# Patient Record
Sex: Female | Born: 2000
Health system: Southern US, Community
[De-identification: ages and names within clinical notes are randomized; demographics above are authoritative.]

## PROBLEM LIST (undated history)

## (undated) DIAGNOSIS — F32A Depression, unspecified: Secondary | ICD-10-CM

## (undated) DIAGNOSIS — M419 Scoliosis, unspecified: Secondary | ICD-10-CM

## (undated) HISTORY — DX: Depression, unspecified: F32.A

## (undated) HISTORY — DX: Scoliosis, unspecified: M41.9

---
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
## 2020-01-20 DIAGNOSIS — F411 Generalized anxiety disorder: Secondary | ICD-10-CM | POA: Diagnosis not present

## 2020-02-03 DIAGNOSIS — F411 Generalized anxiety disorder: Secondary | ICD-10-CM | POA: Diagnosis not present

## 2020-03-09 DIAGNOSIS — F411 Generalized anxiety disorder: Secondary | ICD-10-CM | POA: Diagnosis not present

## 2020-03-15 ENCOUNTER — Ambulatory Visit (HOSPITAL_COMMUNITY)
Admission: EM | Admit: 2020-03-15 | Discharge: 2020-03-15 | Disposition: A | Discharge status: Home / Self Care | Payer: BC Managed Care – PPO | Attending: Family Medicine | Admitting: Family Medicine

## 2020-03-15 ENCOUNTER — Encounter (HOSPITAL_COMMUNITY): Payer: Self-pay | Admitting: Emergency Medicine

## 2020-03-15 DIAGNOSIS — L03011 Cellulitis of right finger: Secondary | ICD-10-CM

## 2020-03-15 MED ORDER — CEPHALEXIN 500 MG PO CAPS: 500.0000 mg | ORAL_CAPSULE | Freq: Three times a day (TID) | ORAL | 0 refills | Status: AC

## 2020-03-15 NOTE — ED Provider Notes (Addendum)
[facility]    CSN: [id number] Arrival date & time: 03/15/20  0807      History   Chief Complaint Chief Complaint  Patient presents with  . Warts    HPI Teresa Landry is a 19 y.o. female.   Teresa Landry presents with complaints of pain and irritation to skin of right thumb, at medial nail bed. She states she had noted what she thought was a wart to the area, over the past two weeks. Skin toned lesion. Denies any nail biting or other trauma to the area. She applied over the counter salicylic acid treatment with a bandage over the area. Last night she noted increased pain. No drainage from the area that she is aware of. No swelling.    ROS per HPI, negative if not otherwise mentioned.      History reviewed. No pertinent past medical history.  There are no problems to display for this patient.   History reviewed. No pertinent surgical history.  OB History   No obstetric history on file.      Home Medications    Prior to Admission medications   Medication Sig Start Date End Date Taking? Authorizing Provider  cephALEXin (KEFLEX) 500 MG capsule Take 1 capsule (500 mg total) by mouth 3 (three) times daily for 7 days. 03/15/20 03/22/20  Georgetta Haber, NP    Family History Family History  Problem Relation Age of Onset  . Healthy Mother   . Healthy Father     Social History Social History   Tobacco Use  . Smoking status: Never Smoker  . Smokeless tobacco: Never Used  Substance Use Topics  . Alcohol use: Never  . Drug use: Never     Allergies   Patient has no known allergies.   Review of Systems Review of Systems   Physical Exam Triage Vital Signs ED Triage Vitals  Enc Vitals Group     BP 03/15/20 0847 113/73     Pulse Rate 03/15/20 0847 77     Resp 03/15/20 0847 13     Temp 03/15/20 0847 98.4 F (36.9 C)     Temp Source 03/15/20 0847 Oral     SpO2 03/15/20 0847 100 %     Weight --      Height --      Head Circumference --       Peak Flow --      Pain Score 03/15/20 0848 4     Pain Loc --      Pain Edu? --      Excl. in GC? --    No data found.  Updated Vital Signs BP 113/73 (BP Location: Left Arm)   Pulse 77   Temp 98.4 F (36.9 C) (Oral)   Resp 13   LMP 02/14/2020 (Approximate)   SpO2 100%    Physical Exam Constitutional:      General: She is not in acute distress.    Appearance: She is well-developed.  Cardiovascular:     Rate and Rhythm: Normal rate.  Pulmonary:     Effort: Pulmonary effort is normal.  Musculoskeletal:     Comments: Approximately 19mm oval area of white skin to medial aspect of right thumb nail, with two small black/ necrotic areas at the edges; no drainage; no fluctuance; mild redness to immediate edges but no extension of redness; pulp of thumb is soft and non tender   Skin:    General: Skin is warm and dry.  Neurological:  Mental Status: She is alert and oriented to person, place, and time.      UC Treatments / Results  Labs (all labs ordered are listed, but only abnormal results are displayed) Labs Reviewed - No data to display  EKG   Radiology No results found.  Procedures Procedures (including critical care time)  Medications Ordered in UC Medications - No data to display  Initial Impression / Assessment and Plan / UC Course  I have reviewed the triage vital signs and the nursing notes.  Pertinent labs & imaging results that were available during my care of the patient were reviewed by me and considered in my medical decision making (see chart for details).     Topical wart treatment and bandage had been applied to affected area, so difficult to determine what original lesion had been present. Discussed that goal of topical wart removal is to debride the wart in layers, however, so this may be expected course of treatment. Keflex provided due to small concern for infection. Encouraged follow up with PCP in two weeks if no improvement or if worsening.  Return precautions provided. Patient verbalized understanding and agreeable to plan.   Final Clinical Impressions(s) / UC Diagnoses   Final diagnoses:  Cellulitis of finger of right hand     Discharge Instructions     This still may be the expected course of the wart you are treating, as the goal of topical salicylic acid is to breakdown the wart in layers.  I have provided antibiotics as well in concern for some infection as well.  If no improvement or any worsening in the next two weeks please follow up with your primary care provider as you may need additional treatment of your wart.  Please return for any worsening- pain, redness, tight swelling to your finger.    ED Prescriptions    Medication Sig Dispense Auth. Provider   cephALEXin (KEFLEX) 500 MG capsule Take 1 capsule (500 mg total) by mouth 3 (three) times daily for 7 days. 21 capsule Georgetta Haber, NP     PDMP not reviewed this encounter.   Georgetta Haber, NP 03/15/20 0933    Georgetta Haber, NP 03/15/20 [phone]

## 2020-03-15 NOTE — ED Triage Notes (Signed)
Pt c/o wart on right thumb onset last week. She states she tried OTC tx but now it looks red and swollen and she is concerned it is infected.

## 2020-03-15 NOTE — Discharge Instructions (Signed)
This still may be the expected course of the wart you are treating, as the goal of topical salicylic acid is to breakdown the wart in layers.  I have provided antibiotics as well in concern for some infection as well.  If no improvement or any worsening in the next two weeks please follow up with your primary care provider as you may need additional treatment of your wart.  Please return for any worsening- pain, redness, tight swelling to your finger.

## 2020-03-16 DIAGNOSIS — F411 Generalized anxiety disorder: Secondary | ICD-10-CM | POA: Diagnosis not present

## 2020-04-06 DIAGNOSIS — B078 Other viral warts: Secondary | ICD-10-CM | POA: Diagnosis not present

## 2020-05-20 DIAGNOSIS — Z23 Encounter for immunization: Secondary | ICD-10-CM | POA: Diagnosis not present

## 2020-05-20 DIAGNOSIS — F419 Anxiety disorder, unspecified: Secondary | ICD-10-CM | POA: Diagnosis not present

## 2020-06-22 DIAGNOSIS — R5382 Chronic fatigue, unspecified: Secondary | ICD-10-CM | POA: Diagnosis not present

## 2020-07-06 DIAGNOSIS — R059 Cough, unspecified: Secondary | ICD-10-CM | POA: Diagnosis not present

## 2020-07-06 DIAGNOSIS — Z20822 Contact with and (suspected) exposure to covid-19: Secondary | ICD-10-CM | POA: Diagnosis not present

## 2020-07-06 DIAGNOSIS — J029 Acute pharyngitis, unspecified: Secondary | ICD-10-CM | POA: Diagnosis not present

## 2020-08-20 DIAGNOSIS — F419 Anxiety disorder, unspecified: Secondary | ICD-10-CM | POA: Diagnosis not present

## 2020-08-31 ENCOUNTER — Other Ambulatory Visit: Payer: BC Managed Care – PPO

## 2020-09-01 ENCOUNTER — Other Ambulatory Visit: Payer: Self-pay

## 2020-09-01 ENCOUNTER — Other Ambulatory Visit: Payer: BC Managed Care – PPO

## 2020-09-01 DIAGNOSIS — Z20822 Contact with and (suspected) exposure to covid-19: Secondary | ICD-10-CM

## 2020-09-03 LAB — NOVEL CORONAVIRUS, NAA: SARS-CoV-2, NAA: NOT DETECTED

## 2020-09-03 LAB — SARS-COV-2, NAA 2 DAY TAT

## 2020-09-16 DIAGNOSIS — F4322 Adjustment disorder with anxiety: Secondary | ICD-10-CM | POA: Diagnosis not present

## 2020-10-02 DIAGNOSIS — F4322 Adjustment disorder with anxiety: Secondary | ICD-10-CM | POA: Diagnosis not present

## 2020-10-05 DIAGNOSIS — F4322 Adjustment disorder with anxiety: Secondary | ICD-10-CM | POA: Diagnosis not present

## 2020-10-21 DIAGNOSIS — F4322 Adjustment disorder with anxiety: Secondary | ICD-10-CM | POA: Diagnosis not present

## 2020-11-06 DIAGNOSIS — F4322 Adjustment disorder with anxiety: Secondary | ICD-10-CM | POA: Diagnosis not present

## 2020-11-13 DIAGNOSIS — F4322 Adjustment disorder with anxiety: Secondary | ICD-10-CM | POA: Diagnosis not present

## 2020-11-18 DIAGNOSIS — F419 Anxiety disorder, unspecified: Secondary | ICD-10-CM | POA: Diagnosis not present

## 2020-11-27 DIAGNOSIS — F4322 Adjustment disorder with anxiety: Secondary | ICD-10-CM | POA: Diagnosis not present

## 2020-11-29 ENCOUNTER — Other Ambulatory Visit: Payer: Self-pay

## 2020-11-29 ENCOUNTER — Emergency Department (HOSPITAL_COMMUNITY)
Admission: EM | Admit: 2020-11-29 | Discharge: 2020-11-30 | Disposition: A | Discharge status: Home / Self Care | Payer: BC Managed Care – PPO | Attending: Emergency Medicine | Admitting: Emergency Medicine

## 2020-11-29 ENCOUNTER — Emergency Department (HOSPITAL_COMMUNITY): Payer: BC Managed Care – PPO

## 2020-11-29 ENCOUNTER — Encounter (HOSPITAL_COMMUNITY): Payer: Self-pay | Admitting: Emergency Medicine

## 2020-11-29 DIAGNOSIS — Y92000 Kitchen of unspecified non-institutional (private) residence as  the place of occurrence of the external cause: Secondary | ICD-10-CM | POA: Insufficient documentation

## 2020-11-29 DIAGNOSIS — R569 Unspecified convulsions: Secondary | ICD-10-CM | POA: Diagnosis not present

## 2020-11-29 DIAGNOSIS — W260XXA Contact with knife, initial encounter: Secondary | ICD-10-CM | POA: Insufficient documentation

## 2020-11-29 DIAGNOSIS — G40909 Epilepsy, unspecified, not intractable, without status epilepticus: Secondary | ICD-10-CM | POA: Diagnosis not present

## 2020-11-29 DIAGNOSIS — Z8669 Personal history of other diseases of the nervous system and sense organs: Secondary | ICD-10-CM | POA: Diagnosis not present

## 2020-11-29 DIAGNOSIS — S61211A Laceration without foreign body of left index finger without damage to nail, initial encounter: Secondary | ICD-10-CM | POA: Diagnosis not present

## 2020-11-29 DIAGNOSIS — S6992XA Unspecified injury of left wrist, hand and finger(s), initial encounter: Secondary | ICD-10-CM | POA: Diagnosis not present

## 2020-11-29 DIAGNOSIS — R9431 Abnormal electrocardiogram [ECG] [EKG]: Secondary | ICD-10-CM | POA: Diagnosis not present

## 2020-11-29 LAB — CBC WITH DIFFERENTIAL/PLATELET
Abs Immature Granulocytes: 0.04 10*3/uL (ref 0.00–0.07)
Basophils Absolute: 0 10*3/uL (ref 0.0–0.1)
Basophils Relative: 0 %
Eosinophils Absolute: 0.1 10*3/uL (ref 0.0–0.5)
Eosinophils Relative: 1 %
HCT: 40 % (ref 36.0–46.0)
Hemoglobin: 13.4 g/dL (ref 12.0–15.0)
Immature Granulocytes: 0 %
Lymphocytes Relative: 31 %
Lymphs Abs: 3 10*3/uL (ref 0.7–4.0)
MCH: 30.3 pg (ref 26.0–34.0)
MCHC: 33.5 g/dL (ref 30.0–36.0)
MCV: 90.5 fL (ref 80.0–100.0)
Monocytes Absolute: 0.7 10*3/uL (ref 0.1–1.0)
Monocytes Relative: 7 %
Neutro Abs: 5.7 10*3/uL (ref 1.7–7.7)
Neutrophils Relative %: 61 %
Platelets: 226 10*3/uL (ref 150–400)
RBC: 4.42 MIL/uL (ref 3.87–5.11)
RDW: 12.8 % (ref 11.5–15.5)
WBC: 9.5 10*3/uL (ref 4.0–10.5)
nRBC: 0 % (ref 0.0–0.2)

## 2020-11-29 LAB — BASIC METABOLIC PANEL
Anion gap: 5 (ref 5–15)
BUN: 9 mg/dL (ref 6–20)
CO2: 27 mmol/L (ref 22–32)
Calcium: 9.7 mg/dL (ref 8.9–10.3)
Chloride: 106 mmol/L (ref 98–111)
Creatinine, Ser: 0.74 mg/dL (ref 0.44–1.00)
GFR, Estimated: >60 mL/min (ref >60)
Glucose, Bld: 89 mg/dL (ref 70–99)
Potassium: 3.5 mmol/L (ref 3.5–5.1)
Sodium: 138 mmol/L (ref 135–145)

## 2020-11-29 LAB — I-STAT BETA HCG BLOOD, ED (MC, WL, AP ONLY): I-stat hCG, quantitative: <5 m[IU]/mL (ref <5)

## 2020-11-29 NOTE — ED Provider Notes (Signed)
Emergency Department Provider Note   I have reviewed the triage vital signs and the nursing notes.   HISTORY  Chief Complaint Seizures   HPI Teresa Landry is a 20 y.o. female with PMH of depression discontinuing Lexapro in the last 2 weeks presents to the emergency department for evaluation of shocklike sensations traveling through the body multiple times per day over the last 10 days.  Patient reports "losing track" for several seconds but no witnessed generalized tonic clonic activity. She does have a family history of epilepsy but has not been diagnosed herself and is not on AEDs.  She did see a neurologist as a child with similar shocklike feelings that her body.  She reports having a brief EEG with no seizure activity found.  Symptoms returned 10 days ago.  She reports multiple episodes per day.  The shocklike sensation travels throughout her body and she often has a brief headache afterwards.  No fevers or chills. No other medication changes.  Patient notes that in addition to these shocklike feelings she has been having some withdrawal symptoms from her Lexapro and had some unsteadiness with using a kitchen knife today and cut the tip of her left index finger in the kitchen today. Tetanus is UTD and wound is hemostatic.   History reviewed. No pertinent past medical history.  There are no problems to display for this patient.   History reviewed. No pertinent surgical history.  Allergies Patient has no known allergies.  Family History  Problem Relation Age of Onset  . Healthy Mother   . Healthy Father     Social History Social History   Tobacco Use  . Smoking status: Never Smoker  . Smokeless tobacco: Never Used  Substance Use Topics  . Alcohol use: Never  . Drug use: Never    Review of Systems  Constitutional: No fever/chills Eyes: No visual changes. ENT: No sore throat. Cardiovascular: Denies chest pain. Respiratory: Denies shortness of  breath. Gastrointestinal: No abdominal pain.  No nausea, no vomiting.  No diarrhea.  No constipation. Genitourinary: Negative for dysuria. Musculoskeletal: Negative for back pain. Skin: Negative for rash. Cut to the tip of the left index finger.  Neurological: Negative for focal weakness or numbness. Intermittent shock like sensation through the body with residual HA.    10-point ROS otherwise negative.  ____________________________________________   PHYSICAL EXAM:  VITAL SIGNS: ED Triage Vitals  Enc Vitals Group     BP 11/29/20 2006 119/66     Pulse Rate 11/29/20 2006 79     Resp 11/29/20 2006 18     Temp 11/29/20 2006 98.4 F (36.9 C)     Temp src --      SpO2 11/29/20 2006 100 %   Constitutional: Alert and oriented. Well appearing and in no acute distress. Eyes: Conjunctivae are normal. PERRL.  Head: Atraumatic. Nose: No congestion/rhinnorhea. Mouth/Throat: Mucous membranes are moist.   Neck: No stridor.   Cardiovascular: Normal rate, regular rhythm. Good peripheral circulation. Grossly normal heart sounds.   Respiratory: Normal respiratory effort.  No retractions. Lungs CTAB. Gastrointestinal: No distention.  Musculoskeletal: No lower extremity tenderness nor edema. No gross deformities of extremities. Neurologic:  Normal speech and language. No gross focal neurologic deficits are appreciated.  Skin:  Skin is warm and dry. 0.25 superficial laceration to the tip of the left index finger without nail involvement. Wound is approximated and hemostatic.   ____________________________________________   LABS (all labs ordered are listed, but only abnormal results are displayed)  Labs Reviewed  CBC WITH DIFFERENTIAL/PLATELET  BASIC METABOLIC PANEL  I-STAT BETA HCG BLOOD, ED ([facility], [facility], [facility] ONLY)   ____________________________________________  EKG   EKG Interpretation  Date/Time:  Monday November 29 2020 20:26:38 EDT Ventricular Rate:  91 PR Interval:  134 QRS  Duration: 94 QT Interval:  336 QTC Calculation: 413 R Axis:   99 Text Interpretation: Normal sinus rhythm with sinus arrhythmia Rightward axis Incomplete right bundle branch block Borderline ECG Confirmed by Alona Bene [phone]) on 11/29/2020 11:28:17 PM       ____________________________________________  RADIOLOGY  CT Head Wo Contrast  Result Date: 11/29/2020 CLINICAL DATA:  History of seizure activity EXAM: CT HEAD WITHOUT CONTRAST TECHNIQUE: Contiguous axial images were obtained from the base of the skull through the vertex without intravenous contrast. COMPARISON:  None. FINDINGS: Brain: No evidence of acute infarction, hemorrhage, hydrocephalus, extra-axial collection or mass lesion/mass effect. Vascular: No hyperdense vessel or unexpected calcification. Skull: Normal. Negative for fracture or focal lesion. Sinuses/Orbits: No acute finding. Other: None. IMPRESSION: No acute intracranial abnormality noted. Electronically Signed   By: Alcide Clever M.D.   On: 11/29/2020 23:53    ____________________________________________   PROCEDURES  Procedure(s) performed:   Procedures   ____________________________________________   INITIAL IMPRESSION / ASSESSMENT AND PLAN / ED COURSE  Pertinent labs & imaging results that were available during my care of the patient were reviewed by me and considered in my medical decision making (see chart for details).   Patient presents to the emergency department for evaluation of shocklike sensation through her body.  This seems fairly atypical for seizure and may be related to coming off of her Lexapro.  She also has a very superficial laceration to the left index finger.  I feel this can likely heal by secondary intention.  We will clean the area and keep the dressing in place.  Plan for CT imaging of the head with work-up consistent with new onset seizure-like activity.  Patient was cautioned not to drive while these episodes are occurring and that  she must be cleared by a neurologist prior to returning to driving.  She verbalized understanding of this. Will place referral to Neurology from the ED if CT negative.   12:30 AM  The finger laceration was irrigated and cleaned using saline and Betadine.  The wound remained well approximated.  Will allow to heal by secondary intention.  CT scan of the head shows no acute intracranial abnormality.  Plan for neurology referral. Discussed wound care along with PCP follow up plan.  ____________________________________________  FINAL CLINICAL IMPRESSION(S) / ED DIAGNOSES  Final diagnoses:  Seizure-like activity (HCC)  Laceration of left index finger without foreign body without damage to nail, initial encounter    Note:  This document was prepared using Dragon voice recognition software and may include unintentional dictation errors.  Alona Bene, MD, [facility]    Brason Berthelot, Arlyss Repress, MD 11/30/20 [phone]

## 2020-11-29 NOTE — ED Triage Notes (Signed)
Emergency Medicine Provider Triage Evaluation Note  Carollyn Etcheverry , a 20 y.o. female  was evaluated in triage.  Pt complains of neurologic complaints. States she has a "buzzing" feeling in her whole body. She also reports sometimes her mouth goes numb when this happens. She has also had intermittent headaches.   Review of Systems  Positive: Buzzing feeling to whole body Negative: Numbness/weakness  Physical Exam  BP 119/66 (BP Location: Left Arm)   Pulse 79   Temp 98.4 F (36.9 C)   Resp 18   SpO2 100%  Gen:   Awake, no distress   HEENT:  Atraumatic  Resp:  Normal effort  Cardiac:  Normal rate  Abd:   Nondistended, nontender  MSK:   Moves extremities without difficulty  Neuro:  Speech clear   Medical Decision Making  Medically screening exam initiated at 8:21 PM.  Appropriate orders placed.  Anjanette Gilkey was informed that the remainder of the evaluation will be completed by another provider, this initial triage assessment does not replace that evaluation, and the importance of remaining in the ED until their evaluation is complete.  Clinical Impression   20 y/o f here with neurologic complaints and is concerned she is having seizures   MSE was initiated and I personally evaluated the patient and placed orders (if any) at  8:21 PM on November 29, 2020.  The patient appears stable so that the remainder of the MSE may be completed by another provider.    Karrie Meres, [place] 11/29/20 2021

## 2020-11-29 NOTE — ED Triage Notes (Signed)
Pt reports she thinks she has been having seizures. States she frequently gets a "buzzed feeling" and tingling, along with disorientation. Denies personal hx of seizures. Denies numbness or weakness. C/o pressure behind her eyes.

## 2020-11-30 NOTE — Discharge Instructions (Signed)
You have been seen in the emergency department today for a likely seizure.  Your workup today including labs are within normal limits.  Please follow up with your doctor as soon as possible regarding today's emergency department visit and your likely seizure.  You will also need to follow up with a neurologist as soon as possible, please call for appointment.  If you have been prescribed a medication for your seizures, please take this medication as prescribed.  As we have discussed it is very important that you DO NOT drive until you have been seen and cleared by your neurologist.  Please drink plenty of fluids, get plenty of sleep and avoid any alcohol or drug use.  Return to the emergency department if you have any further seizures, develop any weakness/numbness of any arm/leg, confusion, slurred speech, or sudden/severe headache.  

## 2020-12-01 ENCOUNTER — Ambulatory Visit: Payer: BC Managed Care – PPO | Admitting: Neurology

## 2020-12-01 ENCOUNTER — Other Ambulatory Visit: Payer: Self-pay

## 2020-12-01 ENCOUNTER — Encounter: Payer: Self-pay | Admitting: Neurology

## 2020-12-01 VITALS — BP 115/70 | HR 74 | Ht 64.0 in | Wt 130.0 lb

## 2020-12-01 DIAGNOSIS — R251 Tremor, unspecified: Secondary | ICD-10-CM

## 2020-12-01 DIAGNOSIS — R252 Cramp and spasm: Secondary | ICD-10-CM

## 2020-12-01 DIAGNOSIS — F419 Anxiety disorder, unspecified: Secondary | ICD-10-CM | POA: Insufficient documentation

## 2020-12-01 DIAGNOSIS — Z820 Family history of epilepsy and other diseases of the nervous system: Secondary | ICD-10-CM

## 2020-12-01 DIAGNOSIS — R6889 Other general symptoms and signs: Secondary | ICD-10-CM

## 2020-12-01 DIAGNOSIS — R5382 Chronic fatigue, unspecified: Secondary | ICD-10-CM | POA: Insufficient documentation

## 2020-12-01 DIAGNOSIS — G9332 Myalgic encephalomyelitis/chronic fatigue syndrome: Secondary | ICD-10-CM | POA: Insufficient documentation

## 2020-12-01 DIAGNOSIS — J358 Other chronic diseases of tonsils and adenoids: Secondary | ICD-10-CM | POA: Insufficient documentation

## 2020-12-01 NOTE — Patient Instructions (Signed)
Seizure, Adult A seizure is a sudden burst of abnormal electrical and chemical activity in the brain. Seizures usually last from 30 seconds to 2 minutes.  What are the causes? Common causes of this condition include:  Fever or infection.  Problems that affect the brain. These may include: ? A brain or head injury. ? Bleeding in the brain. ? A brain tumor.  Low levels of blood sugar or salt.  Kidney problems or liver problems.  Conditions that are passed from parent to child (are inherited).  Problems with a substance, such as: ? Having a reaction to a drug or a medicine. ? Stopping the use of a substance all of a sudden (withdrawal).  A stroke.  Disorders that affect how you develop. Sometimes, the cause may not be known.  What increases the risk?  Having someone in your family who has epilepsy. In this condition, seizures happen again and again over time. They have no clear cause.  Having had a tonic-clonic seizure before. This type of seizure causes you to: ? Tighten the muscles of the whole body. ? Lose consciousness.  Having had a head injury or strokes before.  Having had a lack of oxygen at birth. What are the signs or symptoms? There are many types of seizures. The symptoms vary depending on the type of seizure you have. Symptoms during a seizure  Shaking that you cannot control (convulsions) with fast, jerky movements of muscles.  Stiffness of the body.  Breathing problems.  Feeling mixed up (confused).  Staring or not responding to sound or touch.  Head nodding.  Eyes that blink, flutter, or move fast.  Drooling, grunting, or making clicking sounds with your mouth  Losing control of when you pee or poop. Symptoms before a seizure  Feeling afraid, nervous, or worried.  Feeling like you may vomit.  Feeling like: ? You are moving when you are not. ? Things around you are moving when they are not.  Feeling like you saw or heard something before  (dj vu).  Odd tastes or smells.  Changes in how you see. You may see flashing lights or spots. Symptoms after a seizure  Feeling confused.  Feeling sleepy.  Headache.  Sore muscles. How is this treated? If your seizure stops on its own, you will not need treatment. If your seizure lasts longer than 5 minutes, you will normally need treatment. Treatment may include:  Medicines given through an IV tube.  Avoiding things, such as medicines, that are known to cause your seizures.  Medicines to prevent seizures.  A device to prevent or control seizures.  Surgery.  A diet low in carbohydrates and high in fat (ketogenic diet). Follow these instructions at home: Medicines  Take over-the-counter and prescription medicines only as told by your doctor.  Avoid foods or drinks that may keep your medicine from working, such as alcohol. Activity  Follow instructions about driving, swimming, or doing things that would be dangerous if you had another seizure. Wait until your doctor says it is safe for you to do these things.  If you live in the [place] ask your local department of motor vehicles when you can drive.  Get a lot of rest. Teaching others  Teach friends and family what to do when you have a seizure. They should: ? Help you get down to the ground. ? Protect your head and body. ? Loosen any clothing around your neck. ? Turn you on your side. ? Know whether or not   you need emergency care. ? Stay with you until you are better.  Also, tell them what not to do if you have a seizure. Tell them: ? They should not hold you down. ? They should not put anything in your mouth.   General instructions  Avoid anything that gives you seizures.  Keep a seizure diary. Write down: ? What you remember about each seizure. ? What you think caused each seizure.  Keep all follow-up visits. Contact a doctor if:  You have another seizure or seizures. Call the doctor each time you  have a seizure.  The pattern of your seizures changes.  You keep having seizures with treatment.  You have symptoms of being sick or having an infection.  You are not able to take your medicine. Get help right away if:  You have any of these problems: ? A seizure that lasts longer than 5 minutes. ? Many seizures in a row and you do not feel better between seizures. ? A seizure that makes it harder to breathe. ? A seizure and you can no longer speak or use part of your body.  You do not wake up right after a seizure.  You get hurt during a seizure.  You feel confused or have pain right after a seizure. These symptoms may be an emergency. Get help right away. Call your local emergency services (911 in the [place]  Do not wait to see if the symptoms will go away.  Do not drive yourself to the hospital. Summary  A seizure is a sudden burst of abnormal electrical and chemical activity in the brain. Seizures normally last from 30 seconds to 2 minutes.  Causes of seizures include illness, injury to the head, low levels of blood sugar or salt, and certain conditions.  Most seizures will stop on their own in less than 5 minutes. Seizures that last longer than 5 minutes are a medical emergency and need treatment right away.  Many medicines are used to treat seizures. Take over-the-counter and prescription medicines only as told by your doctor. This information is not intended to replace advice given to you by your health care provider. Make sure you discuss any questions you have with your health care provider. Document Revised: 02/06/2020 Document Reviewed: 02/06/2020 Elsevier Patient Education  2021 [employer]  

## 2020-12-01 NOTE — Progress Notes (Signed)
Provider:  Melvyn Novas, MD  Primary Care Physician:  Teresa Landry [address] [place] [address]     Referring Provider: Maia Plan, Md [address] [place],  [place] [address]          Chief Complaint according to patient   Patient presents with:    . New Patient (Initial Visit)           HISTORY OF PRESENT ILLNESS:  Teresa Landry is a 20 y.o. Caucasian female patient and is seen here upon emergency room referral on 12/01/2020 .  Chief concern according to patient :  " I have been having symptoms since elemantary school, age 20  when I first had spells of confusion and desorientation, some twitching, some staring".    I have the pleasure of seeing Teresa Landry today, a right -handed patient with a PMhx of  GAD, anxiety disorder, Fatigue Syndrome, Presents from ER for 2 wk long hx of "episodes" that ? Is seizure like. She describes that intermittently a sensation comes over her out of no where and its like a buzzine/electric shock feeling throughout her body. She has noted there to be twitching and jerking motions. Denies LOC during the event but after the event she is very lethargic, brain fog and confused having to reorient herself.   PGF has history of seizures. The pt indicated that she has had some of this occur throughout her life but this is first it has occurred back to back. She states that the longest time she has gone without having some form of episode is 2 hrs during this 14 day span. She was clearly advised not to drive by ED and PCP, but has not complied. EEG and MRI have not been completed in years.    Family medical history: paternal grandfather with spells- seizures. He took medications.    Social history:  Patient is working at a [employer], full timeFinancial planner. She has 3 sisters,  and lives in a household with 2 room mates . The patient currently works in shifts( mostly morning, some night) Pets are present. 2 cats. Tobacco use;  vaping .  ETOH use ; none ,  Caffeine intake in form of Coffee( 2 cups ) Soda( /) Tea ( /) but Red bull energy drinks.   Hobbies :outdoor activity   The spells will start in AM soon after waking up, and until she is falling asleep again. The spells started 2-3 weeks ago, were present but less inteanse and less frequent by January / February 2022. Marland Kitchen   Not new- she had spells all her live. Sees some correlation with higher levels of anxeity , denies panic attacks.  During the spell she feels off track, can't concentrate or respond adequately, but hear and sees what is going on.    Review of Systems: Out of a complete 14 system review, the patient complains of only the following symptoms, and all other reviewed systems are negative.:   Staring spells.  Bright lights do bother her. Anxiety.  Neck twitches and hand twitches.     Social History   Socioeconomic History  . Marital status: Single    Spouse name: Not on file  . Number of children: Not on file  . Years of education: Not on file  . Highest education level: Not on file  Occupational History  . Not on file  Tobacco Use  . Smoking status: Never  Smoker  . Smokeless tobacco: Never Used  Vaping Use  . Vaping Use: Some days  Substance and Sexual Activity  . Alcohol use: Never  . Drug use: Yes    Types: Marijuana    Comment: last used 11/30/20  . Sexual activity: Not on file  Other Topics Concern  . Not on file  Social History Narrative  . Not on file   Social Determinants of Health   Financial Resource Strain: Not on file  Food Insecurity: Not on file  Transportation Needs: Not on file  Physical Activity: Not on file  Stress: Not on file  Social Connections: Not on file    Family History  Problem Relation Age of Onset  . Healthy Mother   . Healthy Father   . Seizures Paternal Grandfather     Past Medical History:  Diagnosis Date  . Depression     No past surgical history on file.   Current Outpatient  Medications on File Prior to Visit  Medication Sig Dispense Refill  . acetaminophen (TYLENOL) 500 MG tablet Take 1,000 mg by mouth every 6 (six) hours as needed for moderate pain or headache.    . Dapsone 7.5 % GEL Apply 1 application topically every morning.    Marland Kitchen levonorgestrel-ethinyl estradiol (NORDETTE) 0.15-30 MG-MCG tablet Take 1 tablet by mouth daily.    . Multiple Vitamins-Minerals (ONE-A-DAY WOMENS PO) Take 1 tablet by mouth daily.    . traZODone (DESYREL) 50 MG tablet Take 50 mg by mouth at bedtime as needed for sleep.     No current facility-administered medications on file prior to visit.    No Known Allergies  Physical exam:  Today's Vitals   12/01/20 1419  BP: 115/70  Pulse: 74  Weight: 130 lb (59 kg)  Height: 5\' 4"  (1.626 m)   Body mass index is 22.31 kg/m.   Wt Readings from Last 3 Encounters:  12/01/20 130 lb (59 kg)     Ht Readings from Last 3 Encounters:  12/01/20 5\' 4"  (1.626 m)      General: The patient is awake, alert and appears not in acute distress. The patient is well groomed. Head: Normocephalic, atraumatic. Neck is supple. Mallampati 2,  No tongue bites. Cardiovascular:  Regular rate and cardiac rhythm by pulse,  without distended neck veins. Respiratory: Lungs are clear to auscultation.  Skin:  Without evidence of ankle edema, or rash. Trunk: The patient's posture is erect.   Neurologic exam : The patient is awake and alert, oriented to place and time.   Memory subjective described as intact.  Attention span & concentration ability appears normal.  Speech is fluent, meek.   Mood and affect are anxious, nervous.    Cranial nerves: no loss of smell or taste reported  Pupils are equal and briskly reactive to light. Funduscopic exam deferred. Frequent eye blink. .  Extraocular movements in vertical and horizontal planes were intact and without nystagmus. No Diplopia. Visual fields by finger perimetry are intact. Hearing - she has pulsatile  Tinnitus in both ears, has heard voices (!) .   Facial sensation intact to fine touch.  Facial motor strength is symmetric and tongue and uvula move midline.  Neck ROM : rotation, tilt and flexion extension were normal for age and shoulder shrug was symmetrical.    Motor exam:  Symmetric bulk, tone and ROM.   Normal tone without cog wheeling, symmetric grip strength .   Sensory:  Fine touch  and vibration were normal.  Proprioception tested in the upper extremities was normal.   Coordination: Rapid alternating movements in the fingers/hands were of normal speed.  The Finger-to-nose maneuver was intact with evidence of tremor.   Gait and station: Patient could rise unassisted from a seated position, walked without assistive device.  Stance is of normal width/ base and the patient turned with 3 steps.  Toe and heel walk were deferred.  Deep tendon reflexes: in the  upper and lower extremities are symmetric and intact.  Babinski response was deferred .       After spending a total time of 45 minutes face to face and additional time for physical and neurologic examination, review of laboratory studies,  personal review of imaging studies, reports and results of other testing and review of referral information / records as far as provided in visit, I have established the following assessments:  1)  Tremor in both hands, occassionally twitching in hands and face , eye lid flatter.  2)   Frequent electric shock sensations, radiating from head to limbs, spine. Marland Kitchen  3)  Spells are associated with a reduced level of responsiveness, eyes mostly open,    My Plan is to proceed with:  1) EEG , need HV and lights, and would appreciate camera / Video supervision - .  2) Tremor and twitching are also often related to GAD, anxiety.  3)  No driving !   I would like to thank Teresa Landry and Long, Arlyss Repress, Md [address] [place],  [place] [address] for allowing me to meet with and to take  care of this pleasant patient.   In short, Emon Miggins is presenting with a possoble seizure disorder.   I plan to follow up either personally or through our NP within 2-4 month.   CC: I will share my notes with PCP and ED .  Electronically signed by: Melvyn Novas, MD 12/01/2020 2:36 PM  [facility] Board certified by The [employer] of Sleep Medicine and Diplomate of the [employer] of Sleep Medicine. Board certified In Neurology through the ABPN, Fellow of the [employer] of Neu[employer]. Medical Director of [facility].

## 2020-12-07 ENCOUNTER — Telehealth: Payer: Self-pay | Admitting: Neurology

## 2020-12-07 NOTE — Telephone Encounter (Signed)
LVM for pt to call back about scheduling mri  [employer] auth: NPR Ref # [id number]

## 2020-12-13 NOTE — Telephone Encounter (Signed)
Patient called back and LVM, I returned her call and had to LVM.

## 2020-12-14 NOTE — Telephone Encounter (Signed)
Patient called back she is scheduled at [facility] for 12/21/20.

## 2020-12-18 DIAGNOSIS — F4322 Adjustment disorder with anxiety: Secondary | ICD-10-CM | POA: Diagnosis not present

## 2020-12-21 ENCOUNTER — Ambulatory Visit (INDEPENDENT_AMBULATORY_CARE_PROVIDER_SITE_OTHER): Payer: BC Managed Care – PPO

## 2020-12-21 ENCOUNTER — Other Ambulatory Visit: Payer: Self-pay

## 2020-12-21 DIAGNOSIS — R251 Tremor, unspecified: Secondary | ICD-10-CM | POA: Diagnosis not present

## 2020-12-21 DIAGNOSIS — Z820 Family history of epilepsy and other diseases of the nervous system: Secondary | ICD-10-CM | POA: Diagnosis not present

## 2020-12-21 DIAGNOSIS — R252 Cramp and spasm: Secondary | ICD-10-CM | POA: Diagnosis not present

## 2020-12-21 DIAGNOSIS — R6889 Other general symptoms and signs: Secondary | ICD-10-CM | POA: Diagnosis not present

## 2020-12-21 MED ORDER — GADOBENATE DIMEGLUMINE 529 MG/ML IV SOLN
10.0000 mL | Freq: Once | INTRAVENOUS | Status: AC | PRN
  Administered 2020-12-21: 10 mL via INTRAVENOUS

## 2020-12-22 ENCOUNTER — Encounter: Payer: Self-pay | Admitting: Neurology

## 2020-12-22 ENCOUNTER — Ambulatory Visit: Payer: BC Managed Care – PPO

## 2020-12-22 ENCOUNTER — Telehealth: Payer: Self-pay | Admitting: Neurology

## 2020-12-22 DIAGNOSIS — Z820 Family history of epilepsy and other diseases of the nervous system: Secondary | ICD-10-CM

## 2020-12-22 DIAGNOSIS — R251 Tremor, unspecified: Secondary | ICD-10-CM

## 2020-12-22 DIAGNOSIS — R252 Cramp and spasm: Secondary | ICD-10-CM | POA: Diagnosis not present

## 2020-12-22 DIAGNOSIS — R6889 Other general symptoms and signs: Secondary | ICD-10-CM

## 2020-12-22 NOTE — Procedures (Signed)
    History:  Teresa Landry is a 20 year old patient with a history of episodes of shocklike sensations throughout the body.  She may occasionally have some twitching or jerking motions.  The patient denies any loss of consciousness but after the event she may be somewhat lethargic and slightly confused.  The patient is being evaluated for possible seizures.  This is a routine EEG.  No skull defects are noted.  Medications include birth control pills, trazodone, and multivitamins.  EEG classification: Normal awake and asleep  Description of the recording: The background rhythms of this recording consists of a fairly well modulated medium amplitude background activity of 10 Hz. As the record progresses, the patient initially is in the waking state, but appears to enter the early stage II sleep during the recording, with rudimentary sleep spindles and vertex sharp wave activity seen. During the wakeful state, photic stimulation is performed, and this results in a bilateral and symmetric photic driving response. Hyperventilation was also performed, and this results in a minimal buildup of the background rhythm activities without significant slowing seen. At no time during the recording does there appear to be evidence of spike or spike wave discharges or evidence of focal slowing. EKG monitor shows no evidence of cardiac rhythm abnormalities with a heart rate of 78.  Impression: This is a normal EEG recording in the waking and sleeping state. No evidence of ictal or interictal discharges were seen at any time during the recording.

## 2020-12-22 NOTE — Telephone Encounter (Signed)
-----   Message from Judi Cong, RN sent at 12/22/2020 10:44 AM EDT -----  ----- Message ----- From: Asa Lente, MD Sent: 12/21/2020   6:15 PM EDT To: Melvyn Novas, MD

## 2020-12-22 NOTE — Telephone Encounter (Signed)
The patient to review the MRI results.  Informed the patient that MRI of the brain was normal and there was nothing that appeared concerning.  Patient questioned the results from the EEG. That was just completed today, advised that once Dr. Vickey Huger has reviewed the results we will be in touch.  Patient verbalized understanding had no further questions.

## 2020-12-23 ENCOUNTER — Telehealth: Payer: Self-pay | Admitting: Neurology

## 2020-12-23 NOTE — Telephone Encounter (Signed)
-----   Message from Anson Fret, MD sent at 12/23/2020 11:25 AM EDT ----- EEG normal thanks

## 2020-12-23 NOTE — Telephone Encounter (Signed)
Called the pt to review the EEG results. There was no answer LVM advising the patient the EEG was normal.  Instructed the pt to call back if there are any questions

## 2020-12-25 DIAGNOSIS — F4322 Adjustment disorder with anxiety: Secondary | ICD-10-CM | POA: Diagnosis not present

## 2021-01-01 DIAGNOSIS — F4322 Adjustment disorder with anxiety: Secondary | ICD-10-CM | POA: Diagnosis not present

## 2021-01-08 DIAGNOSIS — F4322 Adjustment disorder with anxiety: Secondary | ICD-10-CM | POA: Diagnosis not present

## 2021-01-15 DIAGNOSIS — F4322 Adjustment disorder with anxiety: Secondary | ICD-10-CM | POA: Diagnosis not present

## 2021-01-22 DIAGNOSIS — F4322 Adjustment disorder with anxiety: Secondary | ICD-10-CM | POA: Diagnosis not present

## 2021-01-29 DIAGNOSIS — F4322 Adjustment disorder with anxiety: Secondary | ICD-10-CM | POA: Diagnosis not present

## 2021-02-05 DIAGNOSIS — F4322 Adjustment disorder with anxiety: Secondary | ICD-10-CM | POA: Diagnosis not present

## 2021-02-12 DIAGNOSIS — F4322 Adjustment disorder with anxiety: Secondary | ICD-10-CM | POA: Diagnosis not present

## 2021-02-24 DIAGNOSIS — Z3041 Encounter for surveillance of contraceptive pills: Secondary | ICD-10-CM | POA: Diagnosis not present

## 2021-02-26 DIAGNOSIS — F4322 Adjustment disorder with anxiety: Secondary | ICD-10-CM | POA: Diagnosis not present

## 2021-03-03 ENCOUNTER — Encounter: Payer: Self-pay | Admitting: Family Medicine

## 2021-03-03 ENCOUNTER — Ambulatory Visit: Payer: BC Managed Care – PPO | Admitting: Family Medicine

## 2021-03-05 DIAGNOSIS — F4322 Adjustment disorder with anxiety: Secondary | ICD-10-CM | POA: Diagnosis not present

## 2021-03-12 DIAGNOSIS — F4322 Adjustment disorder with anxiety: Secondary | ICD-10-CM | POA: Diagnosis not present

## 2021-03-19 DIAGNOSIS — F4322 Adjustment disorder with anxiety: Secondary | ICD-10-CM | POA: Diagnosis not present

## 2021-03-24 DIAGNOSIS — F419 Anxiety disorder, unspecified: Secondary | ICD-10-CM | POA: Diagnosis not present

## 2021-03-26 DIAGNOSIS — F4322 Adjustment disorder with anxiety: Secondary | ICD-10-CM | POA: Diagnosis not present

## 2021-04-05 DIAGNOSIS — F4322 Adjustment disorder with anxiety: Secondary | ICD-10-CM | POA: Diagnosis not present

## 2021-04-09 DIAGNOSIS — F4322 Adjustment disorder with anxiety: Secondary | ICD-10-CM | POA: Diagnosis not present

## 2021-04-14 DIAGNOSIS — F4322 Adjustment disorder with anxiety: Secondary | ICD-10-CM | POA: Diagnosis not present

## 2021-04-16 DIAGNOSIS — F4322 Adjustment disorder with anxiety: Secondary | ICD-10-CM | POA: Diagnosis not present

## 2021-04-21 DIAGNOSIS — F4322 Adjustment disorder with anxiety: Secondary | ICD-10-CM | POA: Diagnosis not present

## 2021-04-28 DIAGNOSIS — F419 Anxiety disorder, unspecified: Secondary | ICD-10-CM | POA: Diagnosis not present

## 2021-04-30 DIAGNOSIS — F4322 Adjustment disorder with anxiety: Secondary | ICD-10-CM | POA: Diagnosis not present

## 2021-05-12 DIAGNOSIS — F4322 Adjustment disorder with anxiety: Secondary | ICD-10-CM | POA: Diagnosis not present

## 2021-05-21 DIAGNOSIS — F4322 Adjustment disorder with anxiety: Secondary | ICD-10-CM | POA: Diagnosis not present

## 2021-05-30 DIAGNOSIS — F4322 Adjustment disorder with anxiety: Secondary | ICD-10-CM | POA: Diagnosis not present

## 2021-06-11 DIAGNOSIS — F4322 Adjustment disorder with anxiety: Secondary | ICD-10-CM | POA: Diagnosis not present

## 2021-06-20 DIAGNOSIS — F4322 Adjustment disorder with anxiety: Secondary | ICD-10-CM | POA: Diagnosis not present

## 2021-06-27 DIAGNOSIS — F4322 Adjustment disorder with anxiety: Secondary | ICD-10-CM | POA: Diagnosis not present

## 2021-07-04 DIAGNOSIS — F4322 Adjustment disorder with anxiety: Secondary | ICD-10-CM | POA: Diagnosis not present

## 2021-07-12 DIAGNOSIS — F4322 Adjustment disorder with anxiety: Secondary | ICD-10-CM | POA: Diagnosis not present

## 2021-07-18 DIAGNOSIS — F4322 Adjustment disorder with anxiety: Secondary | ICD-10-CM | POA: Diagnosis not present

## 2021-07-26 DIAGNOSIS — F419 Anxiety disorder, unspecified: Secondary | ICD-10-CM | POA: Diagnosis not present

## 2021-07-26 DIAGNOSIS — Z8349 Family history of other endocrine, nutritional and metabolic diseases: Secondary | ICD-10-CM | POA: Diagnosis not present

## 2021-07-26 DIAGNOSIS — F17200 Nicotine dependence, unspecified, uncomplicated: Secondary | ICD-10-CM | POA: Diagnosis not present

## 2021-07-26 DIAGNOSIS — R630 Anorexia: Secondary | ICD-10-CM | POA: Diagnosis not present

## 2021-07-26 DIAGNOSIS — F4322 Adjustment disorder with anxiety: Secondary | ICD-10-CM | POA: Diagnosis not present

## 2021-08-01 DIAGNOSIS — F4322 Adjustment disorder with anxiety: Secondary | ICD-10-CM | POA: Diagnosis not present

## 2021-08-15 DIAGNOSIS — F4322 Adjustment disorder with anxiety: Secondary | ICD-10-CM | POA: Diagnosis not present

## 2021-08-22 DIAGNOSIS — F4322 Adjustment disorder with anxiety: Secondary | ICD-10-CM | POA: Diagnosis not present

## 2021-08-31 DIAGNOSIS — F4322 Adjustment disorder with anxiety: Secondary | ICD-10-CM | POA: Diagnosis not present

## 2021-09-05 DIAGNOSIS — F4322 Adjustment disorder with anxiety: Secondary | ICD-10-CM | POA: Diagnosis not present

## 2021-09-06 DIAGNOSIS — R051 Acute cough: Secondary | ICD-10-CM | POA: Diagnosis not present

## 2021-09-06 DIAGNOSIS — J069 Acute upper respiratory infection, unspecified: Secondary | ICD-10-CM | POA: Diagnosis not present

## 2021-09-12 DIAGNOSIS — F4322 Adjustment disorder with anxiety: Secondary | ICD-10-CM | POA: Diagnosis not present

## 2021-09-19 DIAGNOSIS — F4322 Adjustment disorder with anxiety: Secondary | ICD-10-CM | POA: Diagnosis not present

## 2021-09-26 DIAGNOSIS — F4322 Adjustment disorder with anxiety: Secondary | ICD-10-CM | POA: Diagnosis not present

## 2021-10-03 DIAGNOSIS — F4322 Adjustment disorder with anxiety: Secondary | ICD-10-CM | POA: Diagnosis not present

## 2021-10-10 DIAGNOSIS — F4322 Adjustment disorder with anxiety: Secondary | ICD-10-CM | POA: Diagnosis not present

## 2021-10-17 DIAGNOSIS — F4322 Adjustment disorder with anxiety: Secondary | ICD-10-CM | POA: Diagnosis not present

## 2021-10-24 DIAGNOSIS — F4322 Adjustment disorder with anxiety: Secondary | ICD-10-CM | POA: Diagnosis not present

## 2021-10-26 DIAGNOSIS — F419 Anxiety disorder, unspecified: Secondary | ICD-10-CM | POA: Diagnosis not present

## 2021-10-31 DIAGNOSIS — F4322 Adjustment disorder with anxiety: Secondary | ICD-10-CM | POA: Diagnosis not present

## 2021-11-07 DIAGNOSIS — F4322 Adjustment disorder with anxiety: Secondary | ICD-10-CM | POA: Diagnosis not present

## 2021-11-14 DIAGNOSIS — F4322 Adjustment disorder with anxiety: Secondary | ICD-10-CM | POA: Diagnosis not present

## 2021-11-28 DIAGNOSIS — F4322 Adjustment disorder with anxiety: Secondary | ICD-10-CM | POA: Diagnosis not present

## 2021-12-05 DIAGNOSIS — F4322 Adjustment disorder with anxiety: Secondary | ICD-10-CM | POA: Diagnosis not present

## 2021-12-06 DIAGNOSIS — M79671 Pain in right foot: Secondary | ICD-10-CM | POA: Diagnosis not present

## 2021-12-12 DIAGNOSIS — F4322 Adjustment disorder with anxiety: Secondary | ICD-10-CM | POA: Diagnosis not present

## 2021-12-19 DIAGNOSIS — F4322 Adjustment disorder with anxiety: Secondary | ICD-10-CM | POA: Diagnosis not present

## 2021-12-26 DIAGNOSIS — F4322 Adjustment disorder with anxiety: Secondary | ICD-10-CM | POA: Diagnosis not present

## 2022-01-03 DIAGNOSIS — J0100 Acute maxillary sinusitis, unspecified: Secondary | ICD-10-CM | POA: Diagnosis not present

## 2022-01-09 DIAGNOSIS — F4322 Adjustment disorder with anxiety: Secondary | ICD-10-CM | POA: Diagnosis not present

## 2022-01-10 DIAGNOSIS — Z113 Encounter for screening for infections with a predominantly sexual mode of transmission: Secondary | ICD-10-CM | POA: Diagnosis not present

## 2022-01-10 DIAGNOSIS — Z202 Contact with and (suspected) exposure to infections with a predominantly sexual mode of transmission: Secondary | ICD-10-CM | POA: Diagnosis not present

## 2022-01-20 DIAGNOSIS — Z202 Contact with and (suspected) exposure to infections with a predominantly sexual mode of transmission: Secondary | ICD-10-CM | POA: Diagnosis not present

## 2022-01-23 DIAGNOSIS — F4322 Adjustment disorder with anxiety: Secondary | ICD-10-CM | POA: Diagnosis not present

## 2022-01-25 DIAGNOSIS — Z113 Encounter for screening for infections with a predominantly sexual mode of transmission: Secondary | ICD-10-CM | POA: Diagnosis not present

## 2022-01-27 DIAGNOSIS — F4322 Adjustment disorder with anxiety: Secondary | ICD-10-CM | POA: Diagnosis not present

## 2022-02-01 DIAGNOSIS — F4322 Adjustment disorder with anxiety: Secondary | ICD-10-CM | POA: Diagnosis not present

## 2022-02-03 DIAGNOSIS — F4322 Adjustment disorder with anxiety: Secondary | ICD-10-CM | POA: Diagnosis not present

## 2022-02-07 DIAGNOSIS — Z3041 Encounter for surveillance of contraceptive pills: Secondary | ICD-10-CM | POA: Diagnosis not present

## 2022-02-09 DIAGNOSIS — F4322 Adjustment disorder with anxiety: Secondary | ICD-10-CM | POA: Diagnosis not present

## 2022-02-13 DIAGNOSIS — F4322 Adjustment disorder with anxiety: Secondary | ICD-10-CM | POA: Diagnosis not present

## 2022-03-06 DIAGNOSIS — F4322 Adjustment disorder with anxiety: Secondary | ICD-10-CM | POA: Diagnosis not present

## 2022-03-13 DIAGNOSIS — F4322 Adjustment disorder with anxiety: Secondary | ICD-10-CM | POA: Diagnosis not present

## 2022-03-28 DIAGNOSIS — F4322 Adjustment disorder with anxiety: Secondary | ICD-10-CM | POA: Diagnosis not present

## 2022-04-03 DIAGNOSIS — F4322 Adjustment disorder with anxiety: Secondary | ICD-10-CM | POA: Diagnosis not present

## 2022-04-10 DIAGNOSIS — F4322 Adjustment disorder with anxiety: Secondary | ICD-10-CM | POA: Diagnosis not present

## 2022-04-19 DIAGNOSIS — F4322 Adjustment disorder with anxiety: Secondary | ICD-10-CM | POA: Diagnosis not present

## 2022-04-21 DIAGNOSIS — Z202 Contact with and (suspected) exposure to infections with a predominantly sexual mode of transmission: Secondary | ICD-10-CM | POA: Diagnosis not present

## 2022-04-26 DIAGNOSIS — F4322 Adjustment disorder with anxiety: Secondary | ICD-10-CM | POA: Diagnosis not present

## 2022-05-10 DIAGNOSIS — F4322 Adjustment disorder with anxiety: Secondary | ICD-10-CM | POA: Diagnosis not present

## 2022-05-12 DIAGNOSIS — R0602 Shortness of breath: Secondary | ICD-10-CM | POA: Diagnosis not present

## 2022-05-17 DIAGNOSIS — F4322 Adjustment disorder with anxiety: Secondary | ICD-10-CM | POA: Diagnosis not present

## 2022-05-24 DIAGNOSIS — F4322 Adjustment disorder with anxiety: Secondary | ICD-10-CM | POA: Diagnosis not present

## 2022-05-31 DIAGNOSIS — F4322 Adjustment disorder with anxiety: Secondary | ICD-10-CM | POA: Diagnosis not present

## 2022-06-07 DIAGNOSIS — F4322 Adjustment disorder with anxiety: Secondary | ICD-10-CM | POA: Diagnosis not present

## 2022-06-14 DIAGNOSIS — F4322 Adjustment disorder with anxiety: Secondary | ICD-10-CM | POA: Diagnosis not present

## 2022-06-14 DIAGNOSIS — Z79899 Other long term (current) drug therapy: Secondary | ICD-10-CM | POA: Diagnosis not present

## 2022-06-14 DIAGNOSIS — L700 Acne vulgaris: Secondary | ICD-10-CM | POA: Diagnosis not present

## 2022-06-22 DIAGNOSIS — F4322 Adjustment disorder with anxiety: Secondary | ICD-10-CM | POA: Diagnosis not present

## 2022-06-28 DIAGNOSIS — F4322 Adjustment disorder with anxiety: Secondary | ICD-10-CM | POA: Diagnosis not present

## 2022-07-05 DIAGNOSIS — F4322 Adjustment disorder with anxiety: Secondary | ICD-10-CM | POA: Diagnosis not present

## 2022-07-12 DIAGNOSIS — F4322 Adjustment disorder with anxiety: Secondary | ICD-10-CM | POA: Diagnosis not present

## 2022-07-17 DIAGNOSIS — Z79899 Other long term (current) drug therapy: Secondary | ICD-10-CM | POA: Diagnosis not present

## 2022-07-17 DIAGNOSIS — L700 Acne vulgaris: Secondary | ICD-10-CM | POA: Diagnosis not present

## 2022-07-19 DIAGNOSIS — F4322 Adjustment disorder with anxiety: Secondary | ICD-10-CM | POA: Diagnosis not present

## 2022-07-26 DIAGNOSIS — F4322 Adjustment disorder with anxiety: Secondary | ICD-10-CM | POA: Diagnosis not present

## 2022-08-09 DIAGNOSIS — F4322 Adjustment disorder with anxiety: Secondary | ICD-10-CM | POA: Diagnosis not present

## 2022-08-13 IMAGING — CT CT HEAD W/O CM
4 series · 17 of 47 positions shown, 19 images · non-contrast
Comparison: None.

CLINICAL DATA: History of seizure activity

EXAM:
CT HEAD WITHOUT CONTRAST
TECHNIQUE: Contiguous axial images were obtained from the base of the skull
through the vertex without intravenous contrast.

[Series 3: head without · axial · non-contrast · 0.40mm/px · z∈[-162,-42]mm · 7 of 32 slices shown, 9 images]
[im 4/32  brain]
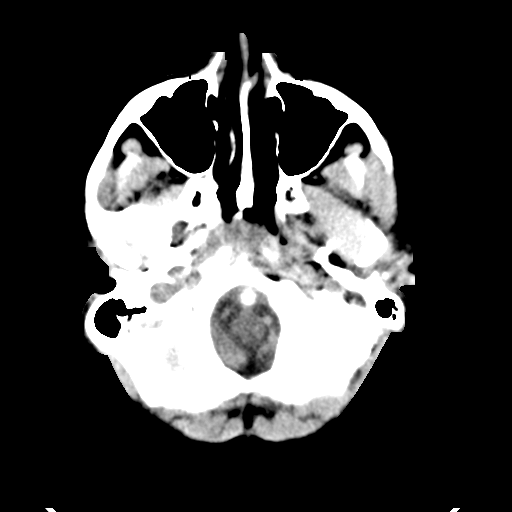
[im 4/32  bone]
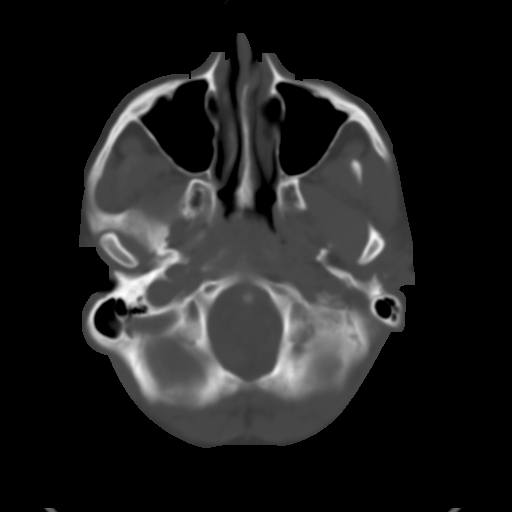
[im 8/32  brain]
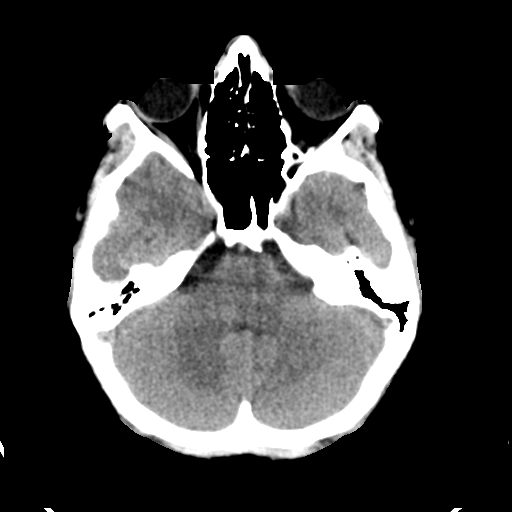
[im 12/32  brain]
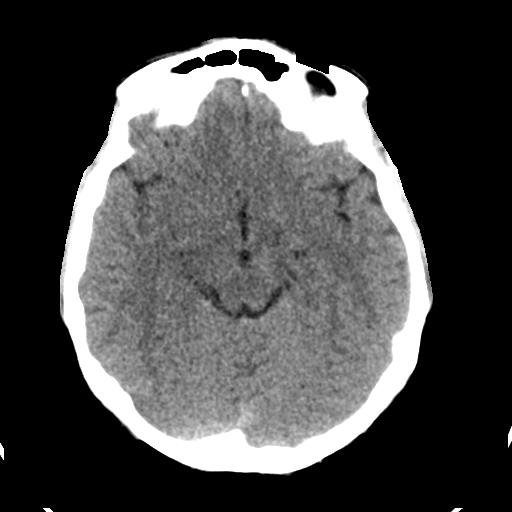
[im 16/32  brain]
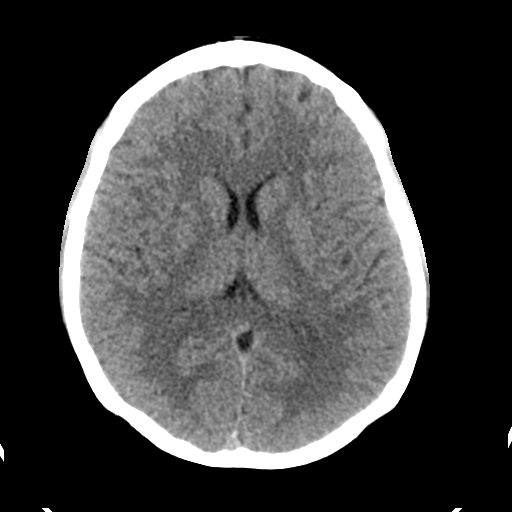
[im 20/32  brain]
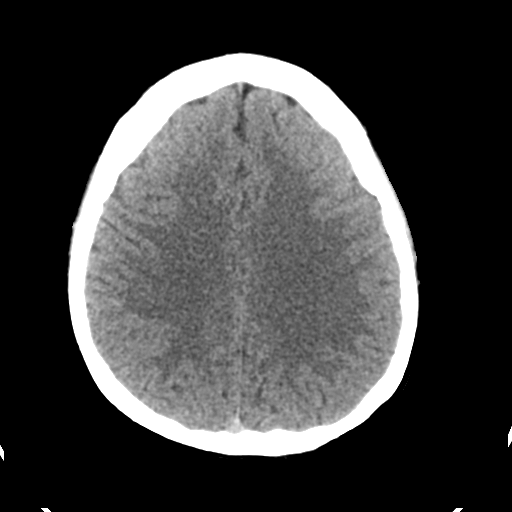
[im 20/32  bone]
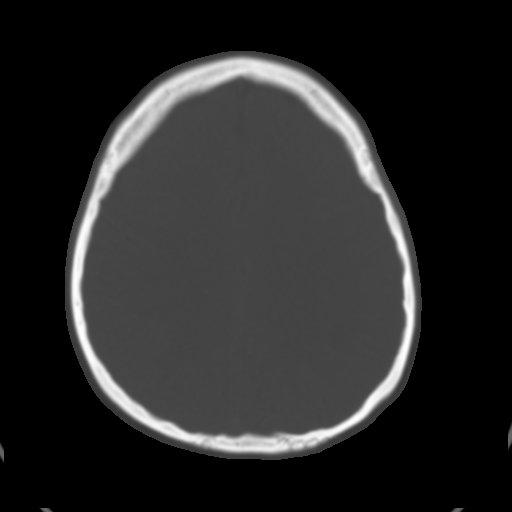
[im 24/32  brain]
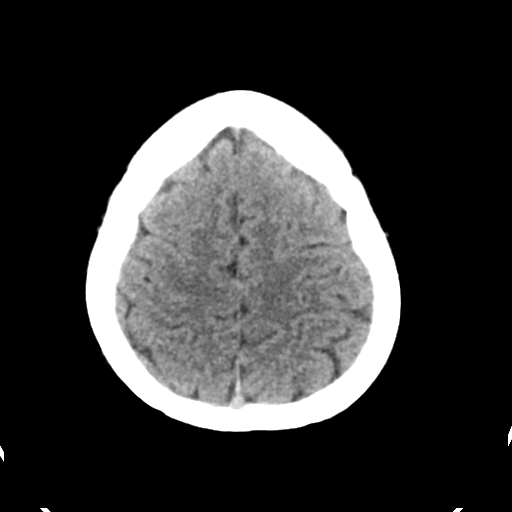
[im 28/32  brain]
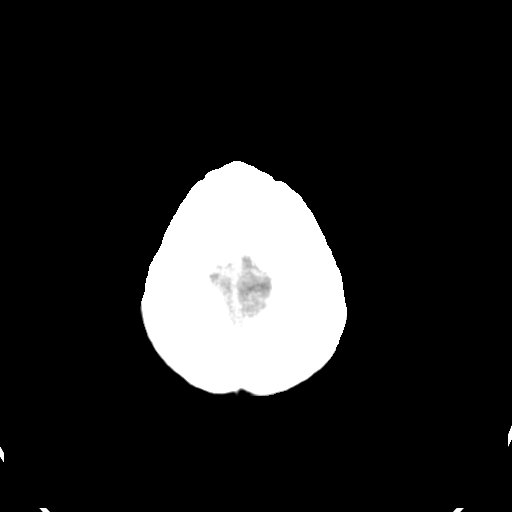

[Series 4: head bone · axial · 0.40mm/px · z∈[-162,-106]mm · 4 of 80 slices shown]
[im 8/80  bone]
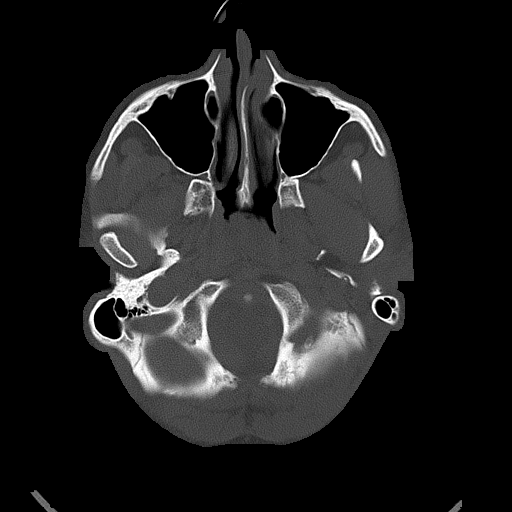
[im 16/80  bone]
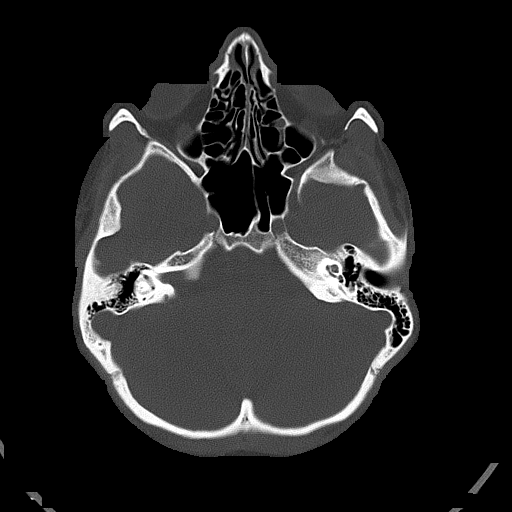
[im 24/80  bone]
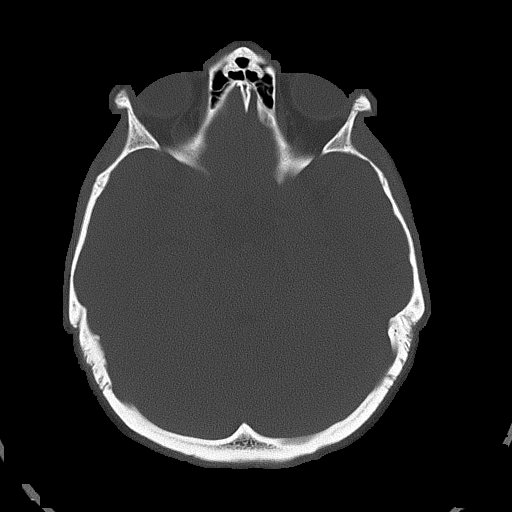
[im 36/80  bone]
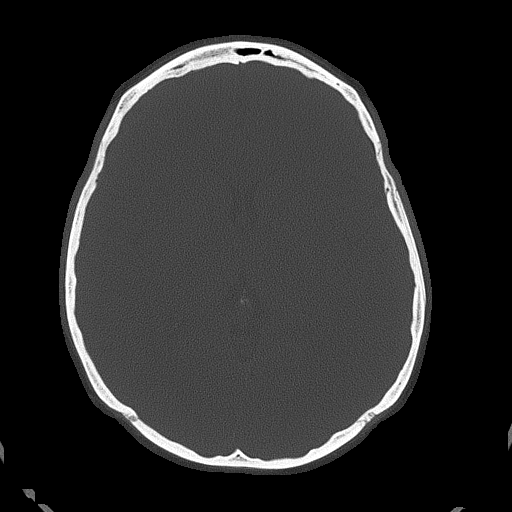

[Series 5: head without cor · coronal · non-contrast · 0.31mm/px · 3 of 67 slices shown]
[im 23/67  brain]
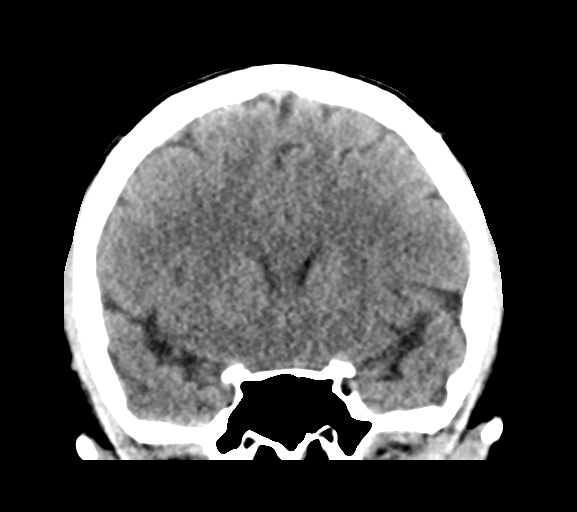
[im 30/67  brain]
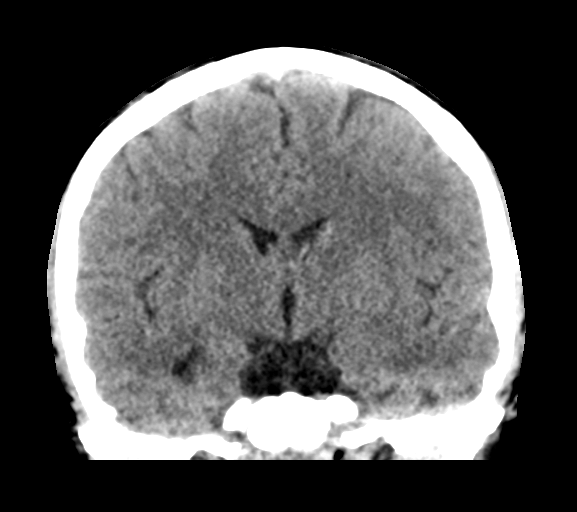
[im 37/67  brain]
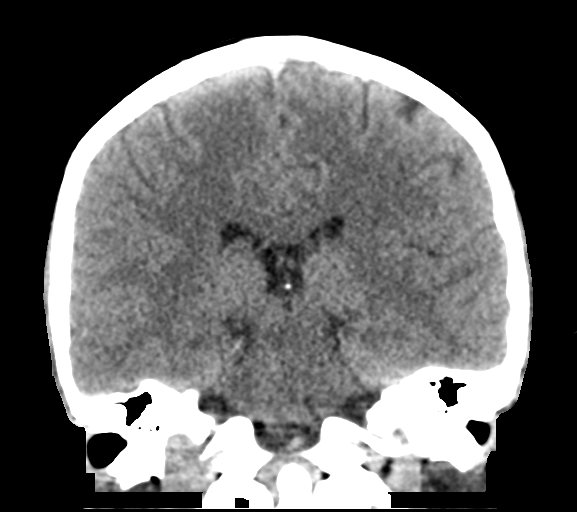

[Series 6: head without sag · sagittal · non-contrast · 0.31mm/px · 3 of 60 slices shown]
[im 20/60  brain]
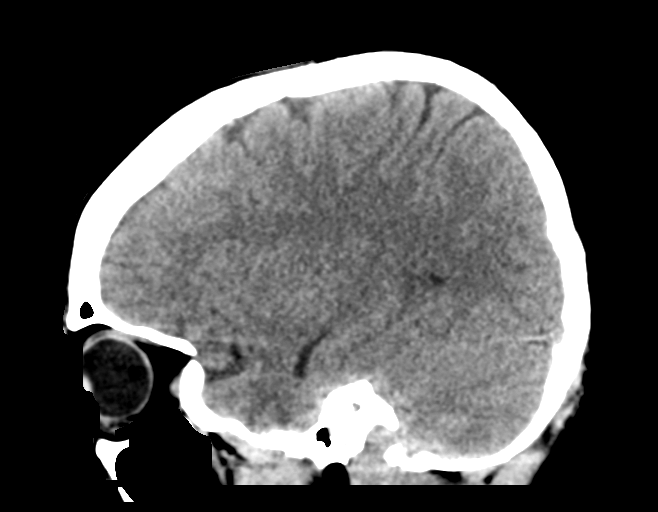
[im 30/60  brain]
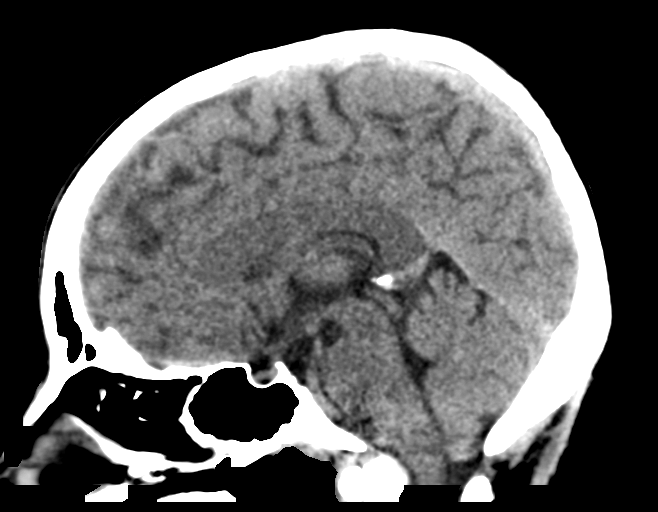
[im 40/60  brain]
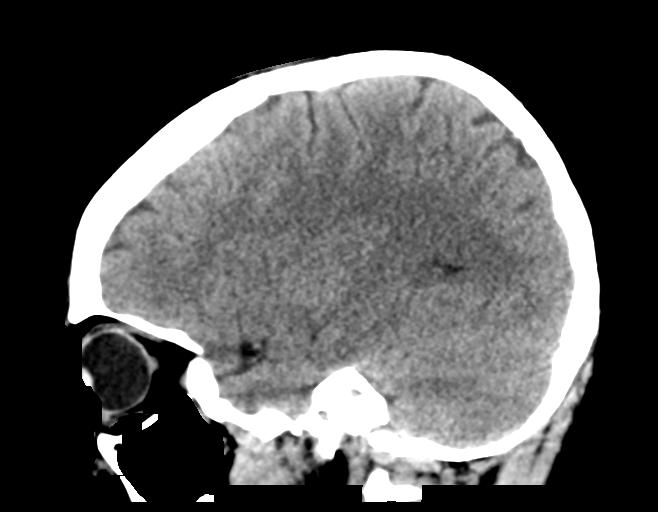

[17 of 47 positions shown; findings below may reference images not displayed]

FINDINGS: Brain: No evidence of acute infarction, hemorrhage, hydrocephalus,
extra-axial collection or mass lesion/mass effect.

Vascular: No hyperdense vessel or unexpected calcification.

Skull: Normal. Negative for fracture or focal lesion.

Sinuses/Orbits: No acute finding.

Other: None.
IMPRESSION: No acute intracranial abnormality noted.

## 2022-08-16 DIAGNOSIS — F4322 Adjustment disorder with anxiety: Secondary | ICD-10-CM | POA: Diagnosis not present

## 2022-08-17 DIAGNOSIS — L700 Acne vulgaris: Secondary | ICD-10-CM | POA: Diagnosis not present

## 2022-08-17 DIAGNOSIS — Z79899 Other long term (current) drug therapy: Secondary | ICD-10-CM | POA: Diagnosis not present

## 2022-08-23 DIAGNOSIS — F4322 Adjustment disorder with anxiety: Secondary | ICD-10-CM | POA: Diagnosis not present

## 2022-08-25 DIAGNOSIS — R531 Weakness: Secondary | ICD-10-CM | POA: Diagnosis not present

## 2022-08-25 DIAGNOSIS — Z79899 Other long term (current) drug therapy: Secondary | ICD-10-CM | POA: Diagnosis not present

## 2022-08-30 DIAGNOSIS — F4322 Adjustment disorder with anxiety: Secondary | ICD-10-CM | POA: Diagnosis not present

## 2022-09-06 DIAGNOSIS — F4322 Adjustment disorder with anxiety: Secondary | ICD-10-CM | POA: Diagnosis not present

## 2022-09-13 DIAGNOSIS — F4322 Adjustment disorder with anxiety: Secondary | ICD-10-CM | POA: Diagnosis not present

## 2022-09-20 DIAGNOSIS — Z79899 Other long term (current) drug therapy: Secondary | ICD-10-CM | POA: Diagnosis not present

## 2022-09-20 DIAGNOSIS — L700 Acne vulgaris: Secondary | ICD-10-CM | POA: Diagnosis not present

## 2022-09-27 DIAGNOSIS — F4322 Adjustment disorder with anxiety: Secondary | ICD-10-CM | POA: Diagnosis not present

## 2022-10-04 DIAGNOSIS — F4322 Adjustment disorder with anxiety: Secondary | ICD-10-CM | POA: Diagnosis not present

## 2022-10-13 DIAGNOSIS — F4322 Adjustment disorder with anxiety: Secondary | ICD-10-CM | POA: Diagnosis not present

## 2022-10-19 DIAGNOSIS — Z79899 Other long term (current) drug therapy: Secondary | ICD-10-CM | POA: Diagnosis not present

## 2022-10-19 DIAGNOSIS — L700 Acne vulgaris: Secondary | ICD-10-CM | POA: Diagnosis not present

## 2022-10-23 ENCOUNTER — Other Ambulatory Visit (HOSPITAL_COMMUNITY)
Admission: RE | Admit: 2022-10-23 | Discharge: 2022-10-23 | Disposition: A | Discharge status: Home / Self Care | Payer: Federal, State, Local not specified - PPO | Source: Ambulatory Visit | Attending: Nurse Practitioner | Admitting: Nurse Practitioner

## 2022-10-23 ENCOUNTER — Other Ambulatory Visit: Payer: Self-pay | Admitting: Nurse Practitioner

## 2022-10-23 DIAGNOSIS — R102 Pelvic and perineal pain: Secondary | ICD-10-CM | POA: Diagnosis not present

## 2022-10-23 DIAGNOSIS — Z3202 Encounter for pregnancy test, result negative: Secondary | ICD-10-CM | POA: Diagnosis not present

## 2022-10-23 DIAGNOSIS — N644 Mastodynia: Secondary | ICD-10-CM | POA: Diagnosis not present

## 2022-10-23 DIAGNOSIS — Z202 Contact with and (suspected) exposure to infections with a predominantly sexual mode of transmission: Secondary | ICD-10-CM | POA: Diagnosis not present

## 2022-10-23 DIAGNOSIS — Z124 Encounter for screening for malignant neoplasm of cervix: Secondary | ICD-10-CM | POA: Insufficient documentation

## 2022-10-23 DIAGNOSIS — R3989 Other symptoms and signs involving the genitourinary system: Secondary | ICD-10-CM | POA: Diagnosis not present

## 2022-10-24 DIAGNOSIS — R102 Pelvic and perineal pain: Secondary | ICD-10-CM | POA: Diagnosis not present

## 2022-10-30 DIAGNOSIS — F4322 Adjustment disorder with anxiety: Secondary | ICD-10-CM | POA: Diagnosis not present

## 2022-10-31 LAB — CYTOLOGY - PAP
Comment: NEGATIVE
Comment: NEGATIVE
Comment: NEGATIVE
Diagnosis: UNDETERMINED — AB
HPV 16: NEGATIVE
HPV 18 / 45: POSITIVE — AB
High risk HPV: POSITIVE — AB

## 2022-11-13 DIAGNOSIS — F4322 Adjustment disorder with anxiety: Secondary | ICD-10-CM | POA: Diagnosis not present

## 2022-11-20 DIAGNOSIS — Z79899 Other long term (current) drug therapy: Secondary | ICD-10-CM | POA: Diagnosis not present

## 2022-11-27 DIAGNOSIS — F4322 Adjustment disorder with anxiety: Secondary | ICD-10-CM | POA: Diagnosis not present

## 2022-12-02 DIAGNOSIS — J019 Acute sinusitis, unspecified: Secondary | ICD-10-CM | POA: Diagnosis not present

## 2022-12-02 DIAGNOSIS — R059 Cough, unspecified: Secondary | ICD-10-CM | POA: Diagnosis not present

## 2022-12-11 DIAGNOSIS — F4322 Adjustment disorder with anxiety: Secondary | ICD-10-CM | POA: Diagnosis not present

## 2023-01-12 DIAGNOSIS — F4322 Adjustment disorder with anxiety: Secondary | ICD-10-CM | POA: Diagnosis not present

## 2023-01-23 DIAGNOSIS — M25571 Pain in right ankle and joints of right foot: Secondary | ICD-10-CM | POA: Diagnosis not present

## 2023-01-23 DIAGNOSIS — F411 Generalized anxiety disorder: Secondary | ICD-10-CM | POA: Diagnosis not present

## 2023-01-23 DIAGNOSIS — Z3041 Encounter for surveillance of contraceptive pills: Secondary | ICD-10-CM | POA: Diagnosis not present

## 2023-02-01 DIAGNOSIS — F4322 Adjustment disorder with anxiety: Secondary | ICD-10-CM | POA: Diagnosis not present

## 2023-03-05 DIAGNOSIS — R079 Chest pain, unspecified: Secondary | ICD-10-CM | POA: Diagnosis not present

## 2023-03-05 DIAGNOSIS — R509 Fever, unspecified: Secondary | ICD-10-CM | POA: Diagnosis not present

## 2023-03-05 DIAGNOSIS — Z87891 Personal history of nicotine dependence: Secondary | ICD-10-CM | POA: Diagnosis not present

## 2023-03-05 DIAGNOSIS — R0602 Shortness of breath: Secondary | ICD-10-CM | POA: Diagnosis not present

## 2023-03-13 DIAGNOSIS — F4322 Adjustment disorder with anxiety: Secondary | ICD-10-CM | POA: Diagnosis not present

## 2023-04-04 DIAGNOSIS — F4322 Adjustment disorder with anxiety: Secondary | ICD-10-CM | POA: Diagnosis not present

## 2023-05-02 DIAGNOSIS — F4322 Adjustment disorder with anxiety: Secondary | ICD-10-CM | POA: Diagnosis not present

## 2023-05-07 DIAGNOSIS — F4322 Adjustment disorder with anxiety: Secondary | ICD-10-CM | POA: Diagnosis not present

## 2023-05-09 DIAGNOSIS — M5441 Lumbago with sciatica, right side: Secondary | ICD-10-CM | POA: Diagnosis not present

## 2023-05-10 ENCOUNTER — Ambulatory Visit
Admission: RE | Admit: 2023-05-10 | Discharge: 2023-05-10 | Disposition: A | Discharge status: Home / Self Care | Payer: Federal, State, Local not specified - PPO | Source: Ambulatory Visit | Attending: Family Medicine | Admitting: Family Medicine

## 2023-05-10 ENCOUNTER — Other Ambulatory Visit: Payer: Self-pay | Admitting: Family Medicine

## 2023-05-10 DIAGNOSIS — M5441 Lumbago with sciatica, right side: Secondary | ICD-10-CM

## 2023-05-10 DIAGNOSIS — M419 Scoliosis, unspecified: Secondary | ICD-10-CM | POA: Diagnosis not present

## 2023-05-10 DIAGNOSIS — M549 Dorsalgia, unspecified: Secondary | ICD-10-CM | POA: Diagnosis not present

## 2023-05-16 DIAGNOSIS — F4322 Adjustment disorder with anxiety: Secondary | ICD-10-CM | POA: Diagnosis not present

## 2023-05-17 DIAGNOSIS — M5416 Radiculopathy, lumbar region: Secondary | ICD-10-CM | POA: Diagnosis not present

## 2023-05-21 DIAGNOSIS — M5416 Radiculopathy, lumbar region: Secondary | ICD-10-CM | POA: Diagnosis not present

## 2023-05-28 DIAGNOSIS — M419 Scoliosis, unspecified: Secondary | ICD-10-CM | POA: Diagnosis not present

## 2023-05-28 DIAGNOSIS — M5416 Radiculopathy, lumbar region: Secondary | ICD-10-CM | POA: Diagnosis not present

## 2023-05-30 DIAGNOSIS — F4322 Adjustment disorder with anxiety: Secondary | ICD-10-CM | POA: Diagnosis not present

## 2023-06-04 DIAGNOSIS — M5416 Radiculopathy, lumbar region: Secondary | ICD-10-CM | POA: Diagnosis not present

## 2023-06-06 ENCOUNTER — Other Ambulatory Visit: Payer: Self-pay | Admitting: Orthopaedic Surgery

## 2023-06-06 DIAGNOSIS — M5416 Radiculopathy, lumbar region: Secondary | ICD-10-CM

## 2023-06-06 DIAGNOSIS — M419 Scoliosis, unspecified: Secondary | ICD-10-CM

## 2023-06-07 DIAGNOSIS — M5416 Radiculopathy, lumbar region: Secondary | ICD-10-CM | POA: Diagnosis not present

## 2023-06-11 DIAGNOSIS — M5416 Radiculopathy, lumbar region: Secondary | ICD-10-CM | POA: Diagnosis not present

## 2023-06-13 ENCOUNTER — Ambulatory Visit
Admission: RE | Admit: 2023-06-13 | Discharge: 2023-06-13 | Disposition: A | Discharge status: Home / Self Care | Payer: Federal, State, Local not specified - PPO | Source: Ambulatory Visit | Attending: Orthopaedic Surgery | Admitting: Orthopaedic Surgery

## 2023-06-13 DIAGNOSIS — M5416 Radiculopathy, lumbar region: Secondary | ICD-10-CM

## 2023-06-13 DIAGNOSIS — M419 Scoliosis, unspecified: Secondary | ICD-10-CM

## 2023-06-16 DIAGNOSIS — F4322 Adjustment disorder with anxiety: Secondary | ICD-10-CM | POA: Diagnosis not present

## 2023-06-18 DIAGNOSIS — Z6823 Body mass index (BMI) 23.0-23.9, adult: Secondary | ICD-10-CM | POA: Diagnosis not present

## 2023-06-18 DIAGNOSIS — M419 Scoliosis, unspecified: Secondary | ICD-10-CM | POA: Diagnosis not present

## 2023-06-24 ENCOUNTER — Other Ambulatory Visit: Payer: Federal, State, Local not specified - PPO

## 2023-06-27 DIAGNOSIS — F4322 Adjustment disorder with anxiety: Secondary | ICD-10-CM | POA: Diagnosis not present

## 2023-07-04 DIAGNOSIS — F4322 Adjustment disorder with anxiety: Secondary | ICD-10-CM | POA: Diagnosis not present

## 2023-07-11 DIAGNOSIS — F4322 Adjustment disorder with anxiety: Secondary | ICD-10-CM | POA: Diagnosis not present

## 2023-08-22 DIAGNOSIS — F4322 Adjustment disorder with anxiety: Secondary | ICD-10-CM | POA: Diagnosis not present

## 2023-08-28 DIAGNOSIS — M4126 Other idiopathic scoliosis, lumbar region: Secondary | ICD-10-CM | POA: Insufficient documentation

## 2023-08-28 DIAGNOSIS — R200 Anesthesia of skin: Secondary | ICD-10-CM | POA: Insufficient documentation

## 2023-08-29 DIAGNOSIS — H938X2 Other specified disorders of left ear: Secondary | ICD-10-CM | POA: Diagnosis not present

## 2023-08-29 DIAGNOSIS — F4322 Adjustment disorder with anxiety: Secondary | ICD-10-CM | POA: Diagnosis not present

## 2023-09-07 DIAGNOSIS — N898 Other specified noninflammatory disorders of vagina: Secondary | ICD-10-CM | POA: Diagnosis not present

## 2023-09-12 DIAGNOSIS — F411 Generalized anxiety disorder: Secondary | ICD-10-CM | POA: Diagnosis not present

## 2023-09-12 DIAGNOSIS — M2550 Pain in unspecified joint: Secondary | ICD-10-CM | POA: Diagnosis not present

## 2023-09-12 DIAGNOSIS — H539 Unspecified visual disturbance: Secondary | ICD-10-CM | POA: Diagnosis not present

## 2023-09-12 DIAGNOSIS — F410 Panic disorder [episodic paroxysmal anxiety] without agoraphobia: Secondary | ICD-10-CM | POA: Diagnosis not present

## 2023-09-13 ENCOUNTER — Other Ambulatory Visit: Payer: Self-pay | Admitting: Family Medicine

## 2023-09-13 DIAGNOSIS — H539 Unspecified visual disturbance: Secondary | ICD-10-CM

## 2023-09-18 ENCOUNTER — Ambulatory Visit
Admission: RE | Admit: 2023-09-18 | Discharge: 2023-09-18 | Disposition: A | Discharge status: Home / Self Care | Payer: Federal, State, Local not specified - PPO | Source: Ambulatory Visit | Attending: Family Medicine | Admitting: Family Medicine

## 2023-09-18 DIAGNOSIS — R531 Weakness: Secondary | ICD-10-CM | POA: Diagnosis not present

## 2023-09-18 DIAGNOSIS — H539 Unspecified visual disturbance: Secondary | ICD-10-CM

## 2023-09-18 DIAGNOSIS — R519 Headache, unspecified: Secondary | ICD-10-CM | POA: Diagnosis not present

## 2023-09-18 MED ORDER — GADOPICLENOL 0.5 MMOL/ML IV SOLN
7.5000 mL | Freq: Once | INTRAVENOUS | Status: AC | PRN
  Administered 2023-09-18: 7.5 mL via INTRAVENOUS

## 2023-09-20 DIAGNOSIS — F411 Generalized anxiety disorder: Secondary | ICD-10-CM | POA: Diagnosis not present

## 2023-09-20 DIAGNOSIS — F410 Panic disorder [episodic paroxysmal anxiety] without agoraphobia: Secondary | ICD-10-CM | POA: Diagnosis not present

## 2023-09-25 DIAGNOSIS — F411 Generalized anxiety disorder: Secondary | ICD-10-CM | POA: Diagnosis not present

## 2023-09-25 DIAGNOSIS — F410 Panic disorder [episodic paroxysmal anxiety] without agoraphobia: Secondary | ICD-10-CM | POA: Diagnosis not present

## 2023-09-27 DIAGNOSIS — F410 Panic disorder [episodic paroxysmal anxiety] without agoraphobia: Secondary | ICD-10-CM | POA: Diagnosis not present

## 2023-09-27 DIAGNOSIS — F411 Generalized anxiety disorder: Secondary | ICD-10-CM | POA: Diagnosis not present

## 2023-10-01 DIAGNOSIS — F411 Generalized anxiety disorder: Secondary | ICD-10-CM | POA: Diagnosis not present

## 2023-10-01 DIAGNOSIS — F410 Panic disorder [episodic paroxysmal anxiety] without agoraphobia: Secondary | ICD-10-CM | POA: Diagnosis not present

## 2023-10-03 DIAGNOSIS — F410 Panic disorder [episodic paroxysmal anxiety] without agoraphobia: Secondary | ICD-10-CM | POA: Diagnosis not present

## 2023-10-03 DIAGNOSIS — F411 Generalized anxiety disorder: Secondary | ICD-10-CM | POA: Diagnosis not present

## 2023-10-17 DIAGNOSIS — F411 Generalized anxiety disorder: Secondary | ICD-10-CM | POA: Diagnosis not present

## 2023-10-17 DIAGNOSIS — F410 Panic disorder [episodic paroxysmal anxiety] without agoraphobia: Secondary | ICD-10-CM | POA: Diagnosis not present

## 2023-10-18 DIAGNOSIS — M7061 Trochanteric bursitis, right hip: Secondary | ICD-10-CM | POA: Diagnosis not present

## 2023-10-24 DIAGNOSIS — F410 Panic disorder [episodic paroxysmal anxiety] without agoraphobia: Secondary | ICD-10-CM | POA: Diagnosis not present

## 2023-10-24 DIAGNOSIS — F411 Generalized anxiety disorder: Secondary | ICD-10-CM | POA: Diagnosis not present

## 2023-10-31 ENCOUNTER — Other Ambulatory Visit (HOSPITAL_COMMUNITY)
Admission: RE | Admit: 2023-10-31 | Discharge: 2023-10-31 | Disposition: A | Discharge status: Home / Self Care | Source: Ambulatory Visit | Attending: Nurse Practitioner | Admitting: Nurse Practitioner

## 2023-10-31 DIAGNOSIS — Z124 Encounter for screening for malignant neoplasm of cervix: Secondary | ICD-10-CM | POA: Diagnosis not present

## 2023-10-31 DIAGNOSIS — R102 Pelvic and perineal pain: Secondary | ICD-10-CM | POA: Diagnosis not present

## 2023-10-31 DIAGNOSIS — Z3041 Encounter for surveillance of contraceptive pills: Secondary | ICD-10-CM | POA: Diagnosis not present

## 2023-10-31 DIAGNOSIS — Z23 Encounter for immunization: Secondary | ICD-10-CM | POA: Diagnosis not present

## 2023-10-31 DIAGNOSIS — Z113 Encounter for screening for infections with a predominantly sexual mode of transmission: Secondary | ICD-10-CM | POA: Diagnosis not present

## 2023-10-31 DIAGNOSIS — Z01419 Encounter for gynecological examination (general) (routine) without abnormal findings: Secondary | ICD-10-CM | POA: Diagnosis not present

## 2023-11-07 LAB — CYTOLOGY - PAP
Comment: NEGATIVE
Diagnosis: UNDETERMINED — AB
High risk HPV: NEGATIVE

## 2023-11-11 DIAGNOSIS — F410 Panic disorder [episodic paroxysmal anxiety] without agoraphobia: Secondary | ICD-10-CM | POA: Diagnosis not present

## 2023-11-11 DIAGNOSIS — F411 Generalized anxiety disorder: Secondary | ICD-10-CM | POA: Diagnosis not present

## 2023-11-15 DIAGNOSIS — F411 Generalized anxiety disorder: Secondary | ICD-10-CM | POA: Diagnosis not present

## 2023-11-15 DIAGNOSIS — F410 Panic disorder [episodic paroxysmal anxiety] without agoraphobia: Secondary | ICD-10-CM | POA: Diagnosis not present

## 2023-11-15 NOTE — Progress Notes (Deleted)
   Office Visit Note  Patient: Teresa Landry             Date of Birth: 2001-03-18           MRN: [id number]             PCP: Sheliah Hatch, PA-C Referring: Koren Shiver, DO Visit Date: 11/16/2023 Occupation: @GUAROCC @  Subjective:  No chief complaint on file.   History of Present Illness: Teresa Landry is a 23 y.o. female ***     Activities of Daily Living:  Patient reports morning stiffness for *** {minute/hour:19697}.   Patient {ACTIONS;DENIES/REPORTS:21021675::"Denies"} nocturnal pain.  Difficulty dressing/grooming: {ACTIONS;DENIES/REPORTS:21021675::"Denies"} Difficulty climbing stairs: {ACTIONS;DENIES/REPORTS:21021675::"Denies"} Difficulty getting out of chair: {ACTIONS;DENIES/REPORTS:21021675::"Denies"} Difficulty using hands for taps, buttons, cutlery, and/or writing: {ACTIONS;DENIES/REPORTS:21021675::"Denies"}  No Rheumatology ROS completed.   PMFS History:  Patient Active Problem List   Diagnosis Date Noted   Anxiety 12/01/2020   Chronic fatigue syndrome 12/01/2020   Tonsil stone 12/01/2020   Family history of epilepsy in paternal grandfather 12/01/2020   Spells of decreased attentiveness 12/01/2020   Jerking movements of extremities 12/01/2020   Tremor observed on examination 12/01/2020    Past Medical History:  Diagnosis Date   Depression     Family History  Problem Relation Age of Onset   Healthy Mother    Healthy Father    Seizures Paternal Grandfather    No past surgical history on file. Social History   Social History Narrative   Not on file    There is no immunization history on file for this patient.   Objective: Vital Signs: There were no vitals taken for this visit.   Physical Exam   Musculoskeletal Exam: ***  CDAI Exam: CDAI Score: -- Patient Global: --; Provider Global: -- Swollen: --; Tender: -- Joint Exam 11/16/2023   No joint exam has been documented for this visit   There is currently no information documented  on the homunculus. Go to the Rheumatology activity and complete the homunculus joint exam.  Investigation: No additional findings.  Imaging: No results found.  Recent Labs: Lab Results  Component Value Date   WBC 9.5 11/29/2020   HGB 13.4 11/29/2020   PLT 226 11/29/2020   NA 138 11/29/2020   K 3.5 11/29/2020   CL 106 11/29/2020   CO2 27 11/29/2020   GLUCOSE 89 11/29/2020   BUN 9 11/29/2020   CREATININE 0.74 11/29/2020   CALCIUM 9.7 11/29/2020    Speciality Comments: No specialty comments available.  Procedures:  No procedures performed Allergies: Patient has no known allergies.   Assessment / Plan:     Visit Diagnoses: No diagnosis found.  Orders: No orders of the defined types were placed in this encounter.  No orders of the defined types were placed in this encounter.   Face-to-face time spent with patient was *** minutes. Greater than 50% of time was spent in counseling and coordination of care.  Follow-Up Instructions: No follow-ups on file.   Fuller Plan, MD  Note - This record has been created using [employer].  Chart creation errors have been sought, but may not always  have been located. Such creation errors do not reflect on  the standard of medical care.

## 2023-11-16 ENCOUNTER — Encounter: Admitting: Internal Medicine

## 2023-11-19 DIAGNOSIS — R102 Pelvic and perineal pain: Secondary | ICD-10-CM | POA: Diagnosis not present

## 2023-11-22 DIAGNOSIS — F410 Panic disorder [episodic paroxysmal anxiety] without agoraphobia: Secondary | ICD-10-CM | POA: Diagnosis not present

## 2023-11-22 DIAGNOSIS — F411 Generalized anxiety disorder: Secondary | ICD-10-CM | POA: Diagnosis not present

## 2023-11-26 DIAGNOSIS — F410 Panic disorder [episodic paroxysmal anxiety] without agoraphobia: Secondary | ICD-10-CM | POA: Diagnosis not present

## 2023-11-29 DIAGNOSIS — F411 Generalized anxiety disorder: Secondary | ICD-10-CM | POA: Diagnosis not present

## 2023-11-29 DIAGNOSIS — F410 Panic disorder [episodic paroxysmal anxiety] without agoraphobia: Secondary | ICD-10-CM | POA: Diagnosis not present

## 2023-12-05 DIAGNOSIS — F411 Generalized anxiety disorder: Secondary | ICD-10-CM | POA: Diagnosis not present

## 2023-12-05 DIAGNOSIS — F410 Panic disorder [episodic paroxysmal anxiety] without agoraphobia: Secondary | ICD-10-CM | POA: Diagnosis not present

## 2023-12-12 DIAGNOSIS — R202 Paresthesia of skin: Secondary | ICD-10-CM | POA: Diagnosis not present

## 2023-12-12 DIAGNOSIS — M25562 Pain in left knee: Secondary | ICD-10-CM | POA: Diagnosis not present

## 2023-12-12 DIAGNOSIS — M791 Myalgia, unspecified site: Secondary | ICD-10-CM | POA: Diagnosis not present

## 2023-12-12 DIAGNOSIS — M549 Dorsalgia, unspecified: Secondary | ICD-10-CM | POA: Diagnosis not present

## 2023-12-12 DIAGNOSIS — M79671 Pain in right foot: Secondary | ICD-10-CM | POA: Diagnosis not present

## 2023-12-12 DIAGNOSIS — M25561 Pain in right knee: Secondary | ICD-10-CM | POA: Diagnosis not present

## 2023-12-12 DIAGNOSIS — M2550 Pain in unspecified joint: Secondary | ICD-10-CM | POA: Diagnosis not present

## 2023-12-12 DIAGNOSIS — M79642 Pain in left hand: Secondary | ICD-10-CM | POA: Diagnosis not present

## 2023-12-12 DIAGNOSIS — M79672 Pain in left foot: Secondary | ICD-10-CM | POA: Diagnosis not present

## 2023-12-12 DIAGNOSIS — R5383 Other fatigue: Secondary | ICD-10-CM | POA: Diagnosis not present

## 2023-12-12 DIAGNOSIS — M79641 Pain in right hand: Secondary | ICD-10-CM | POA: Diagnosis not present

## 2023-12-13 DIAGNOSIS — F410 Panic disorder [episodic paroxysmal anxiety] without agoraphobia: Secondary | ICD-10-CM | POA: Diagnosis not present

## 2023-12-13 DIAGNOSIS — F411 Generalized anxiety disorder: Secondary | ICD-10-CM | POA: Diagnosis not present

## 2023-12-20 DIAGNOSIS — F411 Generalized anxiety disorder: Secondary | ICD-10-CM | POA: Diagnosis not present

## 2023-12-20 DIAGNOSIS — F410 Panic disorder [episodic paroxysmal anxiety] without agoraphobia: Secondary | ICD-10-CM | POA: Diagnosis not present

## 2024-01-01 DIAGNOSIS — R202 Paresthesia of skin: Secondary | ICD-10-CM | POA: Diagnosis not present

## 2024-01-01 DIAGNOSIS — M2550 Pain in unspecified joint: Secondary | ICD-10-CM | POA: Diagnosis not present

## 2024-01-01 DIAGNOSIS — Z23 Encounter for immunization: Secondary | ICD-10-CM | POA: Diagnosis not present

## 2024-01-01 DIAGNOSIS — R5383 Other fatigue: Secondary | ICD-10-CM | POA: Diagnosis not present

## 2024-01-01 DIAGNOSIS — M791 Myalgia, unspecified site: Secondary | ICD-10-CM | POA: Diagnosis not present

## 2024-01-04 DIAGNOSIS — F410 Panic disorder [episodic paroxysmal anxiety] without agoraphobia: Secondary | ICD-10-CM | POA: Diagnosis not present

## 2024-01-04 DIAGNOSIS — F411 Generalized anxiety disorder: Secondary | ICD-10-CM | POA: Diagnosis not present

## 2024-01-10 DIAGNOSIS — F411 Generalized anxiety disorder: Secondary | ICD-10-CM | POA: Diagnosis not present

## 2024-01-10 DIAGNOSIS — F410 Panic disorder [episodic paroxysmal anxiety] without agoraphobia: Secondary | ICD-10-CM | POA: Diagnosis not present

## 2024-01-11 DIAGNOSIS — F410 Panic disorder [episodic paroxysmal anxiety] without agoraphobia: Secondary | ICD-10-CM | POA: Diagnosis not present

## 2024-01-11 DIAGNOSIS — F411 Generalized anxiety disorder: Secondary | ICD-10-CM | POA: Diagnosis not present

## 2024-01-18 DIAGNOSIS — F411 Generalized anxiety disorder: Secondary | ICD-10-CM | POA: Diagnosis not present

## 2024-01-18 DIAGNOSIS — F410 Panic disorder [episodic paroxysmal anxiety] without agoraphobia: Secondary | ICD-10-CM | POA: Diagnosis not present

## 2024-01-21 DIAGNOSIS — F410 Panic disorder [episodic paroxysmal anxiety] without agoraphobia: Secondary | ICD-10-CM | POA: Diagnosis not present

## 2024-01-21 DIAGNOSIS — F411 Generalized anxiety disorder: Secondary | ICD-10-CM | POA: Diagnosis not present

## 2024-01-24 DIAGNOSIS — F410 Panic disorder [episodic paroxysmal anxiety] without agoraphobia: Secondary | ICD-10-CM | POA: Diagnosis not present

## 2024-01-24 DIAGNOSIS — F411 Generalized anxiety disorder: Secondary | ICD-10-CM | POA: Diagnosis not present

## 2024-01-28 DIAGNOSIS — F411 Generalized anxiety disorder: Secondary | ICD-10-CM | POA: Diagnosis not present

## 2024-01-28 DIAGNOSIS — F410 Panic disorder [episodic paroxysmal anxiety] without agoraphobia: Secondary | ICD-10-CM | POA: Diagnosis not present

## 2024-02-18 DIAGNOSIS — F410 Panic disorder [episodic paroxysmal anxiety] without agoraphobia: Secondary | ICD-10-CM | POA: Diagnosis not present

## 2024-02-18 DIAGNOSIS — F411 Generalized anxiety disorder: Secondary | ICD-10-CM | POA: Diagnosis not present

## 2024-02-19 ENCOUNTER — Ambulatory Visit: Attending: Internal Medicine | Admitting: Internal Medicine

## 2024-02-19 ENCOUNTER — Encounter: Payer: Self-pay | Admitting: Internal Medicine

## 2024-02-19 VITALS — BP 97/66 | HR 86 | Resp 12 | Ht 64.0 in | Wt 135.0 lb

## 2024-02-19 DIAGNOSIS — M357 Hypermobility syndrome: Secondary | ICD-10-CM

## 2024-02-19 DIAGNOSIS — Q7962 Hypermobile Ehlers-Danlos syndrome: Secondary | ICD-10-CM | POA: Insufficient documentation

## 2024-02-19 DIAGNOSIS — M7918 Myalgia, other site: Secondary | ICD-10-CM | POA: Diagnosis not present

## 2024-02-19 DIAGNOSIS — G9332 Myalgic encephalomyelitis/chronic fatigue syndrome: Secondary | ICD-10-CM

## 2024-02-19 NOTE — Progress Notes (Signed)
 Office Visit Note  Patient: Teresa Landry             Date of Birth: [birth year]           MRN: [id number]             PCP: Lanis Thresa BROCKS, PA-C Referring: Cindy Clotilda HERO, DO Visit Date: 02/19/2024 Occupation: [employer] Student  Subjective:  New Patient (Initial Visit) (Patient states she has full body pain but the work pain is in her entire back and hips. Patient states sometimes the pain is so bad she cannot walk, sometimes the pain is hot and dry or sharp and achey. )   Discussed the use of AI scribe software for clinical note transcription with the patient, who gave verbal consent to proceed.  History of Present Illness   Teresa Landry is a 23 year old female with chronic musculoskeletal pain and joint hypermobility.  She experiences chronic musculoskeletal pain that is intense and primarily affects her back, hips, knees, and ankles. Initially localized to the lower back and hips, the pain has spread to the upper back, particularly behind the rib cage, over the past two months. She describes two types of pain in her hips: a 'hot, dry, achy feeling' that worsens with movement and a 'sharp, nerve pinching pain' that occurs randomly. The pain is constant in her back and does not subside.  She experiences sciatica in both legs, triggered by activities such as running or occurring spontaneously. Swelling in her ankles and feet is particularly noticeable at night or when she is hot, with her feet becoming 'really red and swollen.' Her hands exhibit similar symptoms, becoming 'really sweaty.'  She has a history of headaches that began in the last year or two, coinciding with the onset of her pain. Headaches occur every morning and typically resolve after she gets out of bed and starts moving. No significant gastrointestinal issues are noted, but she experiences some bloating, which she attributes to dietary habits.  She has a history of Raynaud's phenomenon, with episodes of her feet turning  white and numb in cold conditions, followed by turning blue upon warming. Numbness in her fingers, particularly on the left side, can occur spontaneously or when her elbow is bent. She often wakes up with numb hands, which she attributes to sleeping positions.  She has a history of scoliosis, which she believes may have been exacerbated by past injuries to her foot and knee that were not medically treated. She has not undergone any corrective procedures for her scoliosis. She is currently a Statistician biology and is not engaged in any specific physical activities at this time.  Her family history is notable for her father having tested positive for rheumatoid arthritis. She has previously taken SSRIs for anxiety but is not currently on them. She uses ibuprofen and Tylenol  for pain management and has a muscle relaxer that she avoids due to anxiety about its effects. She has not used neuropathy medications like gabapentin.       Labs reviewed AVISE panel T Cell TigM Ab+ RF neg ESR wnl CRP wnl TSH wnl Vit D wnl Uric acid wnl B12 wnl  Imaging reviewed 12/13/23 Xrays knees, hands, thoracic spine, cervical spine, SI joints, feet - scoliosis and DDD  09/18/23 MRI Brain normal  06/12/2024 MRI Lumbar spine IMPRESSION: Moderate scoliosis. Mild posterior element hypertrophy in the spine with no acute osseous abnormality. No disc degeneration. And capacious spinal canal with no spinal stenosis or neural  impingement.  Activities of Daily Living:  Patient reports morning stiffness for 15-30 minutes.   Patient Reports nocturnal pain.  Difficulty dressing/grooming: Reports Difficulty climbing stairs: Reports Difficulty getting out of chair: Reports Difficulty using hands for taps, buttons, cutlery, and/or writing: Denies  Review of Systems  Constitutional:  Positive for fatigue.  HENT:  Positive for mouth sores and mouth dryness.   Eyes:  Positive for dryness.   Respiratory:  Positive for shortness of breath.   Cardiovascular:  Positive for chest pain and palpitations.  Gastrointestinal:  Negative for blood in stool, constipation and diarrhea.  Endocrine: Negative for increased urination.  Genitourinary:  Positive for involuntary urination.  Musculoskeletal:  Positive for joint pain, gait problem, joint pain, myalgias, muscle weakness, morning stiffness, muscle tenderness and myalgias. Negative for joint swelling.  Skin:  Positive for rash and sensitivity to sunlight. Negative for color change and hair loss.  Allergic/Immunologic: Negative for susceptible to infections.  Neurological:  Positive for dizziness and headaches.  Hematological:  Negative for swollen glands.  Psychiatric/Behavioral:  Positive for depressed mood and sleep disturbance. The patient is nervous/anxious.     PMFS History:  Patient Active Problem List   Diagnosis Date Noted   Hypermobile Ehlers-Danlos syndrome 02/19/2024   Myofascial pain 02/19/2024   Lower extremity numbness 08/28/2023   Other idiopathic scoliosis, lumbar region 08/28/2023   Anxiety 12/01/2020   Chronic fatigue syndrome 12/01/2020   Tonsil stone 12/01/2020   Family history of epilepsy in paternal grandfather 12/01/2020   Spells of decreased attentiveness 12/01/2020   Jerking movements of extremities 12/01/2020   Tremor observed on examination 12/01/2020    Past Medical History:  Diagnosis Date   Depression    Scoliosis     Family History  Problem Relation Age of Onset   Healthy Mother    Healthy Father    Seizures Paternal Grandfather    History reviewed. No pertinent surgical history. Social History   Social History Narrative   Not on file    There is no immunization history on file for this patient.   Objective: Vital Signs: BP 97/66 (BP Location: Right Arm, Patient Position: Sitting, Cuff Size: Normal)   Pulse 86   Resp 12   Ht 5' 4 (1.626 m)   Wt 135 lb (61.2 kg)   LMP  02/13/2024   BMI 23.17 kg/m    Physical Exam Eyes:     Conjunctiva/sclera: Conjunctivae normal.  Cardiovascular:     Rate and Rhythm: Normal rate and regular rhythm.  Pulmonary:     Effort: Pulmonary effort is normal.     Breath sounds: Normal breath sounds.  Musculoskeletal:     Right lower leg: No edema.     Left lower leg: No edema.  Lymphadenopathy:     Cervical: No cervical adenopathy.  Skin:    General: Skin is warm and dry.     Findings: No rash.     Comments: No digital pitting Normal appearing nailfold capillaries  Neurological:     Mental Status: She is alert.  Psychiatric:        Mood and Affect: Mood normal.      Musculoskeletal Exam:  Shoulders full ROM no tenderness or swelling Elbows slightly hyperextensible, no tenderness or swelling Wrists full ROM no tenderness or swelling Fingers increased ROM MCP past 90 degrees and thumb reaches forearm, no focal tenderness or swelling Significant dextroscoliosis and leftward rotation with minimal tenderness to palpation Right hip more externally rotated compared to left, good  ROM Knees full ROM no tenderness or swelling, mild patellar grind test pain and increased mobility Ankles full ROM no tenderness or swelling MTPs full ROM no tenderness or swelling  Investigation: No additional findings.  Imaging: No results found.  Recent Labs: Lab Results  Component Value Date   WBC 9.5 11/29/2020   HGB 13.4 11/29/2020   PLT 226 11/29/2020   NA 138 11/29/2020   K 3.5 11/29/2020   CL 106 11/29/2020   CO2 27 11/29/2020   GLUCOSE 89 11/29/2020   BUN 9 11/29/2020   CREATININE 0.74 11/29/2020   CALCIUM 9.7 11/29/2020    Speciality Comments: No specialty comments available.  Procedures:  No procedures performed Allergies: Celecoxib and Meloxicam   Assessment / Plan:     Visit Diagnoses: Benign joint hypermobility syndrome - Plan: AMB referral to sports medicine Joint hypermobility in shoulders, elbows,  wrists, fingers, and patella causes chronic joint pain. No dislocations, skin fragility, major cardiovascular complication, or ophthalmic complications.  She presents with a consistent sort of phenotype with the associated chronic myofascial type pain as well as orthostatic intolerance and mood and fatigue deficits. - Recommend resistance training to strengthen muscles and reduce hypermobility, will defer formal PT referral to ortho clinic. - Refer to Dr. Joane Sar for joint hypermobility and functional issues.  Cubital and carpal tunnel syndrome Numbness in hands due to cubital and carpal tunnel syndrome, exacerbated by hypermobile joints causing nerve compression. - Provide nerve glide exercises for carpal and cubital tunnel problems.  Chronic fatigue syndrome Myofascial pain Chronic pain syndrome Chronic pain in back, hips, knees, and ankles, related to scoliosis, joint hypermobility, and compensatory muscle changes. No autoimmune or inflammatory condition. - Focus on non-pharmacological pain management. - Refer to Dr. Joane Sar for joint hypermobility and functional issues. - Provide information on [facility]'s resources for chronic pain management.  Scoliosis Significant dextroscoliosis with lateral rotation causes compensatory changes in hip and low back alignment, contributing to pain. No surgical intervention needed.  Erythromelalgia Episodes of redness, swelling, and heat in hands and feet triggered by heat exposure, related to hypermobility and reactive blood vessels. Common in young women, may improve with age.   Orders: Orders Placed This Encounter  Procedures   AMB referral to sports medicine   No orders of the defined types were placed in this encounter.    Follow-Up Instructions: Return if symptoms worsen or fail to improve.   Lonni LELON Ester, MD  Note - This record has been created using [employer].  Chart  creation errors have been sought, but may not always  have been located. Such creation errors do not reflect on  the standard of medical care.

## 2024-02-19 NOTE — Patient Instructions (Addendum)
 I recommend checking out the [facility]  patient-centered guide for fibromyalgia and chronic pain management: [email or web address]  You have benign joint hypermobility which can be a cause for joint pain and vascular symptoms such as raynaud's or erythromelalgia.  I would like to refer you to see one of the sports medicine experts Dr. Artist Lloyd who has some expertise in patients with hypermobility syndromes.

## 2024-02-21 DIAGNOSIS — F411 Generalized anxiety disorder: Secondary | ICD-10-CM | POA: Diagnosis not present

## 2024-02-21 DIAGNOSIS — F410 Panic disorder [episodic paroxysmal anxiety] without agoraphobia: Secondary | ICD-10-CM | POA: Diagnosis not present

## 2024-02-27 ENCOUNTER — Ambulatory Visit: Admitting: Family Medicine

## 2024-02-27 ENCOUNTER — Encounter: Payer: Self-pay | Admitting: Family Medicine

## 2024-02-27 VITALS — BP 104/70 | HR 78 | Ht 64.0 in | Wt 133.0 lb

## 2024-02-27 DIAGNOSIS — R32 Unspecified urinary incontinence: Secondary | ICD-10-CM

## 2024-02-27 DIAGNOSIS — M4126 Other idiopathic scoliosis, lumbar region: Secondary | ICD-10-CM

## 2024-02-27 DIAGNOSIS — G9332 Myalgic encephalomyelitis/chronic fatigue syndrome: Secondary | ICD-10-CM

## 2024-02-27 DIAGNOSIS — M357 Hypermobility syndrome: Secondary | ICD-10-CM | POA: Diagnosis not present

## 2024-02-27 NOTE — Progress Notes (Signed)
 LILLETTE Ileana Collet, PhD, LAT, ATC acting as a scribe for Artist Lloyd, MD.  Teresa Landry is a 23 y.o. female who presents to [facility] at [facility] today for evaluation of her hypermobility. Pt is referred from rheum (Dr. Jeannetta). She notes waking up w/ a HA every morning w/ puffiness in her face and fingers.  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  MS: chronic polyarthralgia/myalgias, back, hips, LE pain Skin/Immune reactions: mouth sores, frequent rashes, skin sensitive to sunlight Nervous system: dizziness, off-balance Head/Spine: chronic fatigue, frequent HA, sleep disturbances, scoliosis CV: SOB, palpitations, chest wall pain,  GI: dietary sensitive Genitourinary: hx of pelvic and perineal pain, involuntary urination Hands & Feet: slender fingers, hypermobility digits  Pertinent review of systems: No fevers or chills  Relevant historical information: Chronic fatigue.  Hypermobility.  Decreased attention and brain fog.  Anxiety.  Scoliosis.   Exam:  BP 104/70   Pulse 78   Ht 5' 4 (1.626 m)   Wt 133 lb (60.3 kg)   LMP 02/13/2024   SpO2 98%   BMI 22.83 kg/m  General: Well Developed, well nourished, and in no acute distress.   MSK: Hypermobility evaluation Beighton hypermobility score 7/9 Ehlers-Danlos evaluation is positive with a score of 5/12     Lab and Radiology Results  EXAM: MRI LUMBAR SPINE WITHOUT CONTRAST   TECHNIQUE: Multiplanar, multisequence MR imaging of the lumbar spine was performed. No intravenous contrast was administered.   COMPARISON:  Lumbar radiographs 05/10/2023.   FINDINGS: Segmentation:  Normal on the comparison.   Alignment: Moderate thoracolumbar junction dextroconvex and mild lower lumbar levoconvex lumbar scoliosis as demonstrated on the comparison. Rotatory component of the curvature on the axial images today. Relatively preserved lumbar lordosis. No spondylolisthesis.   Vertebrae: Normal  background bone marrow signal. No marrow edema or evidence of acute osseous abnormality. Intact visible sacrum and SI joints.   Conus medullaris and cauda equina: Conus extends to the L1 level. No lower spinal cord or conus signal abnormality. Normal cauda equina nerve roots, capacious spinal canal.   Paraspinal and other soft tissues: Negative.   Disc levels:   T10-T11: Partially visible, negative.   T11-T12: Mild facet hypertrophy on the left. No stenosis and otherwise negative.   T12-L1: Mild facet hypertrophy. No stenosis and otherwise negative.   L1-L2: Borderline to mild facet and ligament flavum hypertrophy. No stenosis and otherwise negative.   L2-L3: Similar borderline to mild posterior element hypertrophy and otherwise negative.   L3-L4: Similar borderline to mild ligament flavum, left facet hypertrophy. Otherwise negative.   L4-L5: Mild facet and ligament flavum hypertrophy. Negative disc and no stenosis.   L5-S1: Borderline to mild facet hypertrophy. No stenosis and otherwise negative.   IMPRESSION: Moderate scoliosis. Mild posterior element hypertrophy in the spine with no acute osseous abnormality. No disc degeneration. And capacious spinal canal with no spinal stenosis or neural impingement.     Electronically Signed   By: VEAR Hurst M.D.   On: 06/18/2023 13:40 I, Artist Lloyd, personally (independently) visualized and performed the interpretation of the images attached in this note.     Assessment and Plan: 23 y.o. female with multifactorial chronic pain primarily due to Ehlers-Danlos syndrome and scoliosis.  She has other medical comorbidities that are typically associated with Ehlers-Danlos syndrome as well.  For the primary musculoskeletal pain due to hypermobility will refer to conventional physical therapy well-versed in hypermobility condition.  Will use integrative therapies.  Additionally she is  having a fair amount of pelvic dysfunction  including some occasional urinary incontinence.  This is not typical for a 23 year old woman who has not had a child and is secondary to the hypermobility.  Will refer to pelvic physical therapy as well.  Additionally she is having a fair amount of dysfunction due to dysautonomia/POTS.  We talked about conservative lifestyle management.  She will do some research and reading and consider starting metoprolol.  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  She has a fair amount of GI discomfort.  Will start cetirizine and famotidine.  Recommended some reading including disjointed book.  Recheck in 2 months.   PDMP not reviewed this encounter. Orders Placed This Encounter  Procedures   Ambulatory referral to Physical Therapy    Referral Priority:   Routine    Referral Type:   Physical Medicine    Referral Reason:   Specialty Services Required    Requested Specialty:   Physical Therapy    Number of Visits Requested:   1   Ambulatory referral to Physical Therapy    Referral Priority:   Routine    Referral Type:   Physical Medicine    Referral Reason:   Specialty Services Required    Requested Specialty:   Physical Therapy    Number of Visits Requested:   1   No orders of the defined types were placed in this encounter.    Discussed warning signs or symptoms. Please see discharge instructions. Patient expresses understanding.   The above documentation has been reviewed and is accurate and complete Artist Lloyd, M.D.

## 2024-02-27 NOTE — Patient Instructions (Addendum)
 Thank you for coming in today.  Recommend using Cetirizine and famotidine  Check out the book Disjointed Navigating the Diagnosis and Management of Hypermobile Ehlers-Danlos Syndrome and Hypermobility Spectrum Disorders.   I've referred you to Physical Therapy.  Let us  know if you don't hear from them in one week.   Check back in 2 months

## 2024-03-03 ENCOUNTER — Telehealth: Payer: Self-pay | Admitting: Family Medicine

## 2024-03-03 DIAGNOSIS — F410 Panic disorder [episodic paroxysmal anxiety] without agoraphobia: Secondary | ICD-10-CM | POA: Diagnosis not present

## 2024-03-03 DIAGNOSIS — F411 Generalized anxiety disorder: Secondary | ICD-10-CM | POA: Diagnosis not present

## 2024-03-03 NOTE — Telephone Encounter (Signed)
 Patient called looking for clarification on the therapy referrals that were sent out for her. She said that she received a call from outpatient rehab at [place] for pelvic pt but also had a call from integrative therapies for hypermobility therapy and pelvic therapy.  Please advise.

## 2024-03-06 DIAGNOSIS — F410 Panic disorder [episodic paroxysmal anxiety] without agoraphobia: Secondary | ICD-10-CM | POA: Diagnosis not present

## 2024-03-06 DIAGNOSIS — F411 Generalized anxiety disorder: Secondary | ICD-10-CM | POA: Diagnosis not present

## 2024-03-21 DIAGNOSIS — M357 Hypermobility syndrome: Secondary | ICD-10-CM | POA: Diagnosis not present

## 2024-03-21 DIAGNOSIS — M542 Cervicalgia: Secondary | ICD-10-CM | POA: Diagnosis not present

## 2024-03-21 DIAGNOSIS — M6281 Muscle weakness (generalized): Secondary | ICD-10-CM | POA: Diagnosis not present

## 2024-03-21 DIAGNOSIS — M5459 Other low back pain: Secondary | ICD-10-CM | POA: Diagnosis not present

## 2024-03-28 DIAGNOSIS — M357 Hypermobility syndrome: Secondary | ICD-10-CM | POA: Diagnosis not present

## 2024-03-28 DIAGNOSIS — M6281 Muscle weakness (generalized): Secondary | ICD-10-CM | POA: Diagnosis not present

## 2024-03-28 DIAGNOSIS — M542 Cervicalgia: Secondary | ICD-10-CM | POA: Diagnosis not present

## 2024-03-28 DIAGNOSIS — M5459 Other low back pain: Secondary | ICD-10-CM | POA: Diagnosis not present

## 2024-03-31 ENCOUNTER — Encounter: Admitting: Internal Medicine

## 2024-04-03 DIAGNOSIS — F410 Panic disorder [episodic paroxysmal anxiety] without agoraphobia: Secondary | ICD-10-CM | POA: Diagnosis not present

## 2024-04-04 DIAGNOSIS — M542 Cervicalgia: Secondary | ICD-10-CM | POA: Diagnosis not present

## 2024-04-04 DIAGNOSIS — M5459 Other low back pain: Secondary | ICD-10-CM | POA: Diagnosis not present

## 2024-04-04 DIAGNOSIS — M6281 Muscle weakness (generalized): Secondary | ICD-10-CM | POA: Diagnosis not present

## 2024-04-04 DIAGNOSIS — M357 Hypermobility syndrome: Secondary | ICD-10-CM | POA: Diagnosis not present

## 2024-04-10 NOTE — Progress Notes (Unsigned)
   I, Teresa Landry am a scribe for Dr. Artist Lloyd, MD.  [name] is a 23 y.o. female who presents to [facility] at [facility] today for stomach pain and HA. Pt was last seen by Dr. Lloyd on 02/27/24 for her EDS  Today, pt c/o stomach pain and HA x about two months for stomach pains and headaches are pretty much daily. Pt wonder if this is related to her EDS. Pain for both keeps her up at night and she is currently having trouble sleeping. Feels like her ears and eyes get tight and a feeling of vertigo. This has only happened twice but is concerning for the patient. Once school starts and she is sitting for a long time, it hurts her back and it effects her ability to walk.   Pertinent review of systems: Positive for abdominal discomfort lightheadedness dizziness headache chronic back pain  Relevant historical information: EDS   Exam:  BP 110/78   Pulse 100   Ht 5' 4 (1.626 m)   Wt 138 lb 9.6 oz (62.9 kg)   SpO2 99%   BMI 23.79 kg/m  General: Well Developed, well nourished, and in no acute distress.   MSK: L-spine decreased lumbar motion. Lower extremity strength is intact.    Lab and Radiology Results  Twelve-lead EKG.  Normal sinus rhythm.  RSR V1 lead.  Unremarkable EKG.     Assessment and Plan: 23 y.o. female with Ehlers-Danlos syndrome complicated by musculoskeletal pain, POTS/dysautonomia, abdominal discomfort thought to be due to mast cell activation syndrome.  POTS: Maximize conservative management with 3 L of water and up to 8 to 12 g of salt a day with compression devices stockings and abdominal binder.  Additionally discussed exercise protocol to avoid deconditioning following the [facility] protocol.  EKG completed today.  Will add echocardiogram.  I do not think her skin is going to tolerate the adhesive with a long-term monitor so we will hold off on that for now.  Mast cell activation syndrome did not tolerate Zyrtec.  Recommend Claritin or  Allegra with famotidine.  Also will add cromolyn  oral solution. If abdominal discomfort continues we will proceed with GI workup.  Back pain: Core strengthening heating pad recommended.  Recheck in 1 month PDMP not reviewed this encounter. Orders Placed This Encounter  Procedures   EKG 12-Lead   ECHOCARDIOGRAM COMPLETE    Standing Status:   Future    Expiration Date:   04/11/2025    Where [facility] performed[facility]    Does the patient weigh less than or greater than 250 lbs?:   Patient weighs less than 250 lbs    Perflutren DEFINITY (image enhancing agent) should be administered unless hypersensitivity or allergy exist:   Do NOT administer Perflutren    Reason for no Perflutren:   Known hypersensitivity/allergy    Reason for exam-Echo:   Dilated cardiomyopathy I42.0   Meds ordered this encounter  Medications   cromolyn  (GASTROCROM ) 100 MG/5ML solution    Sig: Take 2.5 mLs (50 mg total) by mouth 4 (four) times daily -  before meals and at bedtime.    Dispense:  480 mL    Refill:  12     Discussed warning signs or symptoms. Please see discharge instructions. Patient expresses understanding.   The above documentation has been reviewed and is accurate and complete Artist Lloyd, M.D.

## 2024-04-11 ENCOUNTER — Ambulatory Visit: Admitting: Family Medicine

## 2024-04-11 VITALS — BP 110/78 | HR 100 | Ht 64.0 in | Wt 138.6 lb

## 2024-04-11 DIAGNOSIS — D8940 Mast cell activation, unspecified: Secondary | ICD-10-CM

## 2024-04-11 DIAGNOSIS — R000 Tachycardia, unspecified: Secondary | ICD-10-CM

## 2024-04-11 DIAGNOSIS — M542 Cervicalgia: Secondary | ICD-10-CM | POA: Diagnosis not present

## 2024-04-11 DIAGNOSIS — M6281 Muscle weakness (generalized): Secondary | ICD-10-CM | POA: Diagnosis not present

## 2024-04-11 DIAGNOSIS — M357 Hypermobility syndrome: Secondary | ICD-10-CM | POA: Diagnosis not present

## 2024-04-11 DIAGNOSIS — Q7962 Hypermobile Ehlers-Danlos syndrome: Secondary | ICD-10-CM

## 2024-04-11 DIAGNOSIS — F411 Generalized anxiety disorder: Secondary | ICD-10-CM | POA: Diagnosis not present

## 2024-04-11 DIAGNOSIS — G90A Postural orthostatic tachycardia syndrome (POTS): Secondary | ICD-10-CM | POA: Diagnosis not present

## 2024-04-11 DIAGNOSIS — M5459 Other low back pain: Secondary | ICD-10-CM | POA: Diagnosis not present

## 2024-04-11 DIAGNOSIS — F410 Panic disorder [episodic paroxysmal anxiety] without agoraphobia: Secondary | ICD-10-CM | POA: Diagnosis not present

## 2024-04-11 MED ORDER — CROMOLYN SODIUM 100 MG/5ML PO CONC: 50.0000 mg | Freq: Three times a day (TID) | ORAL | 12 refills | Status: DC

## 2024-04-11 NOTE — Patient Instructions (Addendum)
 Thank you for coming in today.   [email or web address]  Compression stockings  Abdominal binder  You have a Echocardiogram   [facility] [address], [address] [place], [place] [address]   Salt 8-12 g a day and Water 3L a day.

## 2024-04-17 ENCOUNTER — Ambulatory Visit (HOSPITAL_COMMUNITY)
Admission: RE | Admit: 2024-04-17 | Discharge: 2024-04-17 | Disposition: A | Discharge status: Home / Self Care | Source: Ambulatory Visit | Attending: Family Medicine | Admitting: Family Medicine

## 2024-04-17 DIAGNOSIS — Q796 Ehlers-Danlos syndrome, unspecified: Secondary | ICD-10-CM | POA: Insufficient documentation

## 2024-04-17 DIAGNOSIS — Q7962 Hypermobile Ehlers-Danlos syndrome: Secondary | ICD-10-CM | POA: Insufficient documentation

## 2024-04-17 DIAGNOSIS — I420 Dilated cardiomyopathy: Secondary | ICD-10-CM | POA: Diagnosis not present

## 2024-04-17 DIAGNOSIS — R519 Headache, unspecified: Secondary | ICD-10-CM | POA: Diagnosis not present

## 2024-04-17 DIAGNOSIS — G90A Postural orthostatic tachycardia syndrome (POTS): Secondary | ICD-10-CM | POA: Diagnosis not present

## 2024-04-17 DIAGNOSIS — H5213 Myopia, bilateral: Secondary | ICD-10-CM | POA: Diagnosis not present

## 2024-04-17 LAB — ECHOCARDIOGRAM COMPLETE
Area-P 1/2: 3.63 cm2
Calc EF: 66.4 %
S' Lateral: 3.1 cm
Single Plane A2C EF: 67.4 %
Single Plane A4C EF: 64.9 %

## 2024-04-18 ENCOUNTER — Ambulatory Visit: Payer: Self-pay | Admitting: Family Medicine

## 2024-04-18 DIAGNOSIS — M5459 Other low back pain: Secondary | ICD-10-CM | POA: Diagnosis not present

## 2024-04-18 DIAGNOSIS — M6281 Muscle weakness (generalized): Secondary | ICD-10-CM | POA: Diagnosis not present

## 2024-04-18 DIAGNOSIS — M357 Hypermobility syndrome: Secondary | ICD-10-CM | POA: Diagnosis not present

## 2024-04-18 DIAGNOSIS — M542 Cervicalgia: Secondary | ICD-10-CM | POA: Diagnosis not present

## 2024-04-18 NOTE — Progress Notes (Signed)
 Echocardiogram looks normal.  This is good news.  We can discuss this further during your follow-up visit on the 17th.

## 2024-04-25 DIAGNOSIS — F410 Panic disorder [episodic paroxysmal anxiety] without agoraphobia: Secondary | ICD-10-CM | POA: Diagnosis not present

## 2024-04-25 DIAGNOSIS — M6281 Muscle weakness (generalized): Secondary | ICD-10-CM | POA: Diagnosis not present

## 2024-04-25 DIAGNOSIS — M5459 Other low back pain: Secondary | ICD-10-CM | POA: Diagnosis not present

## 2024-04-25 DIAGNOSIS — M542 Cervicalgia: Secondary | ICD-10-CM | POA: Diagnosis not present

## 2024-04-25 DIAGNOSIS — M357 Hypermobility syndrome: Secondary | ICD-10-CM | POA: Diagnosis not present

## 2024-04-29 NOTE — Progress Notes (Unsigned)
   LILLETTE Ileana Collet, PhD, LAT, ATC acting as a scribe for Artist Lloyd, MD.  Teresa Landry is a 23 y.o. female who presents to [facility] at [facility] today for f/u EDS, POTS/dysautonomia, and MCAS. Pt was last seen by Dr. Lloyd on 04/11/24 and a echocardiogram was ordered and advised on salt/water consumption, compression devices, and [place] protocol. Pt was also prescribed cromolyn  and advised to try Claritin or Allegra with famotidine     Today, pt reports ***  Dx testing: 04/17/24 Echocardiogram  04/11/24 EKG  Pertinent review of systems: ***  Relevant historical information: ***   Exam:  There were no vitals taken for this visit. General: Well Developed, well nourished, and in no acute distress.   MSK: ***    Lab and Radiology Results No results found for this or any previous visit (from the past 72 hours). No results found.     Assessment and Plan: 23 y.o. female with ***   PDMP not reviewed this encounter. No orders of the defined types were placed in this encounter.  No orders of the defined types were placed in this encounter.    Discussed warning signs or symptoms. Please see discharge instructions. Patient expresses understanding.   ***

## 2024-04-30 ENCOUNTER — Ambulatory Visit (INDEPENDENT_AMBULATORY_CARE_PROVIDER_SITE_OTHER): Admitting: Family Medicine

## 2024-04-30 ENCOUNTER — Other Ambulatory Visit: Payer: Self-pay

## 2024-04-30 ENCOUNTER — Ambulatory Visit: Attending: Family Medicine | Admitting: Physical Therapy

## 2024-04-30 ENCOUNTER — Encounter: Payer: Self-pay | Admitting: Physical Therapy

## 2024-04-30 ENCOUNTER — Encounter: Payer: Self-pay | Admitting: Family Medicine

## 2024-04-30 ENCOUNTER — Ambulatory Visit (INDEPENDENT_AMBULATORY_CARE_PROVIDER_SITE_OTHER)

## 2024-04-30 VITALS — BP 110/68 | HR 88 | Ht 64.0 in | Wt 141.0 lb

## 2024-04-30 DIAGNOSIS — Q7962 Hypermobile Ehlers-Danlos syndrome: Secondary | ICD-10-CM

## 2024-04-30 DIAGNOSIS — M6281 Muscle weakness (generalized): Secondary | ICD-10-CM | POA: Insufficient documentation

## 2024-04-30 DIAGNOSIS — M542 Cervicalgia: Secondary | ICD-10-CM

## 2024-04-30 DIAGNOSIS — R32 Unspecified urinary incontinence: Secondary | ICD-10-CM | POA: Diagnosis not present

## 2024-04-30 DIAGNOSIS — R293 Abnormal posture: Secondary | ICD-10-CM | POA: Insufficient documentation

## 2024-04-30 DIAGNOSIS — G90A Postural orthostatic tachycardia syndrome (POTS): Secondary | ICD-10-CM | POA: Diagnosis not present

## 2024-04-30 DIAGNOSIS — R279 Unspecified lack of coordination: Secondary | ICD-10-CM | POA: Insufficient documentation

## 2024-04-30 DIAGNOSIS — M357 Hypermobility syndrome: Secondary | ICD-10-CM | POA: Diagnosis not present

## 2024-04-30 DIAGNOSIS — D8940 Mast cell activation, unspecified: Secondary | ICD-10-CM

## 2024-04-30 MED ORDER — FAMOTIDINE 20 MG PO TABS: 20.0000 mg | ORAL_TABLET | Freq: Two times a day (BID) | ORAL | 2 refills | Status: DC

## 2024-04-30 MED ORDER — CETIRIZINE HCL 10 MG PO TABS
10.0000 mg | ORAL_TABLET | Freq: Every day | ORAL | 3 refills | Status: AC
Start: 2024-04-30 — End: ?
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
## 2024-04-30 NOTE — Patient Instructions (Addendum)
 Thank you for coming in today.   I've sent a prescription for Zyrtec  & famotidine  to your pharmacy.   Please get an Xray today before you leave   You should hear from MRI scheduling within 1 week. If you do not hear please let me know.    1/4 tsp of salt is slightly more than 1g  Check back in 1 month

## 2024-05-02 ENCOUNTER — Encounter: Payer: Self-pay | Admitting: Family Medicine

## 2024-05-02 ENCOUNTER — Ambulatory Visit: Attending: Family Medicine

## 2024-05-02 DIAGNOSIS — G90A Postural orthostatic tachycardia syndrome (POTS): Secondary | ICD-10-CM

## 2024-05-02 DIAGNOSIS — R000 Tachycardia, unspecified: Secondary | ICD-10-CM

## 2024-05-02 DIAGNOSIS — Z23 Encounter for immunization: Secondary | ICD-10-CM | POA: Diagnosis not present

## 2024-05-02 NOTE — Progress Notes (Unsigned)
 EP to read.

## 2024-05-05 ENCOUNTER — Ambulatory Visit: Payer: Self-pay | Admitting: Family Medicine

## 2024-05-05 NOTE — Progress Notes (Signed)
 X-ray cervical spine does show a little bit of arthritis.

## 2024-05-05 NOTE — Telephone Encounter (Signed)
Forwarding to Dr. Corey to review.  

## 2024-05-06 NOTE — Telephone Encounter (Signed)
 Forwarding to Dr. Denyse Amass to review and advise.

## 2024-05-06 NOTE — Telephone Encounter (Signed)
 Called and spoke with patient, she is not feeling great but no severe episodes. Has not received long-term monitor yet but did get a text about signing up for the [employer] App. She will reach out if she has not received the monitor by the end of the week or if things change with her sx.

## 2024-05-07 ENCOUNTER — Encounter: Payer: Self-pay | Admitting: Family Medicine

## 2024-05-08 ENCOUNTER — Ambulatory Visit: Admitting: Physical Therapy

## 2024-05-08 DIAGNOSIS — R279 Unspecified lack of coordination: Secondary | ICD-10-CM

## 2024-05-08 DIAGNOSIS — F410 Panic disorder [episodic paroxysmal anxiety] without agoraphobia: Secondary | ICD-10-CM | POA: Diagnosis not present

## 2024-05-08 DIAGNOSIS — M357 Hypermobility syndrome: Secondary | ICD-10-CM

## 2024-05-08 DIAGNOSIS — M6281 Muscle weakness (generalized): Secondary | ICD-10-CM | POA: Diagnosis not present

## 2024-05-08 DIAGNOSIS — R32 Unspecified urinary incontinence: Secondary | ICD-10-CM | POA: Diagnosis not present

## 2024-05-08 DIAGNOSIS — R293 Abnormal posture: Secondary | ICD-10-CM | POA: Diagnosis not present
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
## 2024-05-08 NOTE — Patient Instructions (Signed)
 Toileting Mechanics 101: Urination  Try to make sure that you sit all the way down on the toilet, feet flat on the floor, with your trunk relaxed  When you initially start urinating, do not push the urine out, rather, let it flow naturally  When you think you are done peeing, this is when you get permission to gently push to see if there is any more urine remaining. Make sure to exhale when you do this pushing  This is called double voiding technique  You can try taking 3 deep belly breaths (no kegels) to see if you can rush the oxygen and blood flow down to your pelvic floor and release any more urine  Defecation  Try to make sure that you sit all the way down on the toilet, feet flat on the floor, with your trunk relaxed You can use a small toilet stool or foot stool under your feet when you poop to make it easier to pass bowel movements  When you need to push the stool out, imagine you are fogging up a mirror with your breath as you exhale and gently push down You can also try taking deep belly breaths when you feel constipated to see if this can help you evacuate the stool  You can try the potty dance too    About Abdominal Massage  Abdominal massage, also called external colon massage, is a self-treatment circular massage technique that can reduce and eliminate gas and ease constipation. The colon naturally contracts in waves in a clockwise direction starting from inside the right hip, moving up toward the ribs, across the belly, and down inside the left hip.  When you perform circular abdominal massage, you help stimulate your colon's normal wave pattern of movement called peristalsis.  It is most beneficial when done after eating.  Positioning You can practice abdominal massage with oil while lying down, or in the shower with soap.  Some people find that it is just as effective to do the massage through clothing while sitting or standing.  How to Massage Start by placing your finger tips  or knuckles on your right side, just inside your hip bone.  Make small circular movements while you move upward toward your rib cage.   Once you reach the bottom right side of your rib cage, take your circular movements across to the left side of the bottom of your rib cage.  Next, move downward until you reach the inside of your left hip bone.  This is the path your feces travel in your colon. Continue to perform your abdominal massage in this pattern for 10 minutes each day.     You can apply as much pressure as is comfortable in your massage.  Start gently and build pressure as you continue to practice.  Notice any areas of pain as you massage; areas of slight pain may be relieved as you massage, but if you have areas of significant or intense pain, consult with your healthcare provider.  Other Considerations General physical activity including bending and stretching can have a beneficial massage-like effect on the colon.  Deep breathing can also stimulate the colon because breathing deeply activates the same nervous system that supplies the colon.   Abdominal massage should always be used in combination with a bowel-conscious diet that is high in the proper type of fiber for you, fluids (primarily water), and a regular exercise program.

## 2024-05-09 DIAGNOSIS — M357 Hypermobility syndrome: Secondary | ICD-10-CM | POA: Diagnosis not present

## 2024-05-09 DIAGNOSIS — M542 Cervicalgia: Secondary | ICD-10-CM | POA: Diagnosis not present

## 2024-05-09 DIAGNOSIS — M6281 Muscle weakness (generalized): Secondary | ICD-10-CM | POA: Diagnosis not present

## 2024-05-09 DIAGNOSIS — M5459 Other low back pain: Secondary | ICD-10-CM | POA: Diagnosis not present

## 2024-05-14 ENCOUNTER — Telehealth: Payer: Self-pay | Admitting: Family Medicine

## 2024-05-14 NOTE — Telephone Encounter (Signed)
 Forwarding to Dr. Denyse Amass to review and advise.

## 2024-05-14 NOTE — Telephone Encounter (Signed)
 Patient called and stated that she has a week long heart monitor she has been wearing. It is causing a pretty bad rash but it is supposed to come off this Friday and she wanted to ask if she can take it off Thursday instead. She put it on last Friday. Please advise.

## 2024-05-15 DIAGNOSIS — F410 Panic disorder [episodic paroxysmal anxiety] without agoraphobia: Secondary | ICD-10-CM | POA: Diagnosis not present

## 2024-05-15 DIAGNOSIS — F411 Generalized anxiety disorder: Secondary | ICD-10-CM | POA: Diagnosis not present

## 2024-05-15 NOTE — Telephone Encounter (Signed)
 Okay to take it off early

## 2024-05-16 ENCOUNTER — Other Ambulatory Visit

## 2024-05-16 DIAGNOSIS — M6281 Muscle weakness (generalized): Secondary | ICD-10-CM | POA: Diagnosis not present

## 2024-05-16 DIAGNOSIS — M357 Hypermobility syndrome: Secondary | ICD-10-CM | POA: Diagnosis not present

## 2024-05-16 DIAGNOSIS — M542 Cervicalgia: Secondary | ICD-10-CM | POA: Diagnosis not present

## 2024-05-16 DIAGNOSIS — M5459 Other low back pain: Secondary | ICD-10-CM | POA: Diagnosis not present

## 2024-05-19 ENCOUNTER — Ambulatory Visit
Admission: RE | Admit: 2024-05-19 | Discharge: 2024-05-19 | Disposition: A | Discharge status: Home / Self Care | Source: Ambulatory Visit | Attending: Family Medicine | Admitting: Family Medicine

## 2024-05-19 DIAGNOSIS — M47812 Spondylosis without myelopathy or radiculopathy, cervical region: Secondary | ICD-10-CM | POA: Diagnosis not present

## 2024-05-19 DIAGNOSIS — M542 Cervicalgia: Secondary | ICD-10-CM

## 2024-05-19 DIAGNOSIS — M50221 Other cervical disc displacement at C4-C5 level: Secondary | ICD-10-CM | POA: Diagnosis not present

## 2024-05-20 NOTE — Progress Notes (Signed)
 Cervical spine MRI does show some arthritis and a area where the nerves could be pinched a bit coming out of the neck.  Will talk about this during your office visit on the 17th in more detail.

## 2024-05-21 DIAGNOSIS — G90A Postural orthostatic tachycardia syndrome (POTS): Secondary | ICD-10-CM

## 2024-05-21 DIAGNOSIS — R000 Tachycardia, unspecified: Secondary | ICD-10-CM

## 2024-05-22 ENCOUNTER — Ambulatory Visit: Payer: Self-pay | Admitting: Family Medicine

## 2024-05-22 DIAGNOSIS — F411 Generalized anxiety disorder: Secondary | ICD-10-CM | POA: Diagnosis not present

## 2024-05-22 NOTE — Progress Notes (Signed)
 Heart monitor looked okay.  No dangerous rhythms.

## 2024-05-23 DIAGNOSIS — M542 Cervicalgia: Secondary | ICD-10-CM | POA: Diagnosis not present

## 2024-05-23 DIAGNOSIS — M5459 Other low back pain: Secondary | ICD-10-CM | POA: Diagnosis not present

## 2024-05-23 DIAGNOSIS — M357 Hypermobility syndrome: Secondary | ICD-10-CM | POA: Diagnosis not present

## 2024-05-23 DIAGNOSIS — M6281 Muscle weakness (generalized): Secondary | ICD-10-CM | POA: Diagnosis not present

## 2024-05-29 NOTE — Progress Notes (Signed)
 LILLETTE Ileana Collet, PhD, LAT, ATC acting as a scribe for Artist Lloyd, MD.  Teresa Landry is a 23 y.o. female who presents to [facility] at [facility] today for 24-month f/u hEDS POTS/dysautonomia, neck pain, HA, and MCAS. Pt was last seen by Dr. Lloyd on 04/30/24 and a c-spine MRI was ordered. Fluid/salt intake was reviews and pt was prescribed Zyrtec  and famotidine  and advised to start taking cromolyn  as previously prescribed.  Today, pt reports she's been trying to increase fluid/salt intake. She started the Zyrtec  and wanting to only start 1 rx at a time. Not taking the cromolyn .  She notes continued bothersome right sided neck pain without pain radiating down the arm.  Additionally she notes bothersome tachycardia especially with exercise.  Dx testing: 05/19/24 C-spine MRI  04/30/24 C-spine XR 04/17/24 Echocardiogram             04/11/24 EKG  Pertinent review of systems: No fevers or chills  Relevant historical information: POTS hypermobile EDS scoliosis.   Exam:  BP 114/68   Pulse 90   Ht 5' 4 (1.626 m)   Wt 143 lb (64.9 kg)   LMP 04/08/2024   SpO2 99%   BMI 24.55 kg/m  General: Well Developed, well nourished, and in no acute distress.   MSK: C-spine nontender to palpation midline tender palpation right cervical paraspinal musculature.  Decreased cervical motion.    Lab and Radiology Results  LONG TERM MONITOR (3-14 DAYS) Result Date: 05/21/2024 Patch Wear Time:  8 days and 9 hours HR 50 - 168, average 90 bpm. No atrial fibrillation detected. Rare supraventricular ectopy. Rare ventricular ectopy. No sustained arrhythmias. Symptom trigger episodes correspond to sinus rhythm and sinus tachycardia [facility] Cardiac Electrophysiology  MR CERVICAL SPINE WO CONTRAST Result Date: 05/19/2024 CLINICAL DATA:  Provided history: Cervicalgia. Neck pain. EXAM: MRI CERVICAL SPINE WITHOUT CONTRAST TECHNIQUE: Multiplanar, multisequence MR imaging of the cervical  spine was performed. No intravenous contrast was administered. COMPARISON:  Cervical spine radiograph 04/30/2024. FINDINGS: Alignment: No significant spondylolisthesis. Vertebrae: Cervical vertebral body height is maintained. No significant marrow edema or focal worrisome marrow lesion. Cord: No signal abnormality identified within the cervical spinal cord. Posterior Fossa, vertebral arteries, paraspinal tissues: No abnormality identified within included portions of the posterior fossa. Flow voids preserved within the visible portions of the cervical vertebral arteries. No paraspinal mass or collection. Disc levels: No more than mild disc degeneration. C2-C3: No significant disc herniation or stenosis. C3-C4: Slight disc bulge. Uncovertebral vertebrae on the right. No significant spinal canal stenosis. Mild-to-moderate right neural foraminal narrowing. C4-C5: Slight disc bulge. No significant spinal canal or foraminal stenosis. C5-C6: No significant disc herniation or stenosis. C6-C7: No significant disc herniation or stenosis. C7-T1: No significant disc herniation or stenosis. IMPRESSION: Cervical spondylosis as outlined within the body of the report. No significant spinal canal stenosis. Mild-to-moderate neural foraminal narrowing on the right at C3-C4 due to uncovertebral hypertrophy. No more than mild disc degeneration. Electronically Signed   By: Rockey Childs D.O.   On: 05/19/2024 20:37   DG Cervical Spine With Flex & Extend Result Date: 05/02/2024 CLINICAL DATA:  Cervical instability and neck pain.  No injury. EXAM: CERVICAL SPINE COMPLETE WITH FLEXION AND EXTENSION VIEWS COMPARISON:  None Available. FINDINGS: Vertebral body alignment, heights and disc space heights are normal. No evidence of instability on flexion and extension. No significant neural foraminal narrowing on the oblique views. Prevertebral soft tissues are normal. Mild uncovertebral joint spurring at the  approximate C3-4 level. Atlantoaxial  articulation is normal. IMPRESSION: 1. No acute findings. 2. Mild uncovertebral joint spurring at the approximate C3-4 level. Electronically Signed   By: Toribio Agreste M.D.   On: 05/02/2024 14:37   ECHOCARDIOGRAM COMPLETE Result Date: 04/17/2024    ECHOCARDIOGRAM REPORT   Patient Name:   Teresa Landry Date of Exam: 04/17/2024 Medical Rec #:  [id number]    Height:       64.0 in Accession #:    [id number]   Weight:       138.6 lb Date of Birth:  2001/03/09    BSA:          1.674 m Patient Age:    23 years     BP:           110/78 mmHg Patient Gender: F            HR:           70 bpm. Exam Location:  Outpatient Procedure: 2D Echo, Cardiac Doppler, Color Doppler and Strain Analysis (Both            Spectral and Color Flow Doppler were utilized during procedure). Indications:    Dilated Cardiomyopathy  History:        Patient has no prior history of Echocardiogram examinations.                 Ehlers Danlos,POTS.  Sonographer:    Therisa Crouch Referring Phys: ARTIST GORMAN LLOYD IMPRESSIONS  1. Left ventricular ejection fraction, by estimation, is 60 to 65%. The left ventricle has normal function. The left ventricle has no regional wall motion abnormalities. Left ventricular diastolic parameters were normal. The average left ventricular global longitudinal strain is -21.5 %. The global longitudinal strain is normal.  2. Right ventricular systolic function is normal. The right ventricular size is normal.  3. The mitral valve is normal in structure. No evidence of mitral valve regurgitation. No evidence of mitral stenosis.  4. The aortic valve is tricuspid. Aortic valve regurgitation is trivial. No aortic stenosis is present.  5. The inferior vena cava is normal in size with greater than 50% respiratory variability, suggesting right atrial pressure of 3 mmHg. Comparison(s): No prior Echocardiogram. Conclusion(s)/Recommendation(s): Normal biventricular function without evidence of hemodynamically significant valvular heart disease.  FINDINGS  Left Ventricle: Left ventricular ejection fraction, by estimation, is 60 to 65%. The left ventricle has normal function. The left ventricle has no regional wall motion abnormalities. The average left ventricular global longitudinal strain is -21.5 %. Strain was performed and the global longitudinal strain is normal. The left ventricular internal cavity size was normal in size. There is no left ventricular hypertrophy. Left ventricular diastolic parameters were normal. Right Ventricle: The right ventricular size is normal. No increase in right ventricular wall thickness. Right ventricular systolic function is normal. Left Atrium: Left atrial size was normal in size. Right Atrium: Right atrial size was normal in size. Pericardium: There is no evidence of pericardial effusion. Mitral Valve: The mitral valve is normal in structure. No evidence of mitral valve regurgitation. No evidence of mitral valve stenosis. Tricuspid Valve: The tricuspid valve is normal in structure. Tricuspid valve regurgitation is not demonstrated. No evidence of tricuspid stenosis. Aortic Valve: The aortic valve is tricuspid. Aortic valve regurgitation is trivial. No aortic stenosis is present. Pulmonic Valve: The pulmonic valve was not well visualized. Pulmonic valve regurgitation is not visualized. No evidence of pulmonic stenosis. Aorta: The aortic root, ascending aorta and aortic arch  are all structurally normal, with no evidence of dilitation or obstruction. Venous: The inferior vena cava is normal in size with greater than 50% respiratory variability, suggesting right atrial pressure of 3 mmHg. IAS/Shunts: The atrial septum is grossly normal.  LEFT VENTRICLE PLAX 2D LVIDd:         4.70 cm     Diastology LVIDs:         3.10 cm     LV e' medial:    16.10 cm/s LV PW:         0.80 cm     LV E/e' medial:  4.4 LV IVS:        0.70 cm     LV e' lateral:   21.30 cm/s LVOT diam:     1.90 cm     LV E/e' lateral: 3.3 LVOT Area:     2.84 cm                             2D Longitudinal Strain                            2D Strain GLS Avg:     -21.5 % LV Volumes (MOD) LV vol d, MOD A2C: 94.4 ml LV vol d, MOD A4C: 92.9 ml LV vol s, MOD A2C: 30.8 ml LV vol s, MOD A4C: 32.6 ml LV SV MOD A2C:     63.6 ml LV SV MOD A4C:     92.9 ml LV SV MOD BP:      63.3 ml RIGHT VENTRICLE             IVC RV Basal diam:  3.10 cm     IVC diam: 2.00 cm RV S prime:     13.90 cm/s TAPSE (M-mode): 2.6 cm LEFT ATRIUM             Index        RIGHT ATRIUM          Index LA diam:        2.50 cm 1.49 cm/m   RA Area:     7.91 cm LA Vol (A2C):   32.0 ml 19.12 ml/m  RA Volume:   13.70 ml 8.18 ml/m LA Vol (A4C):   33.9 ml 20.25 ml/m LA Biplane Vol: 35.9 ml 21.45 ml/m   AORTA Ao Root diam: 2.50 cm Ao Asc diam:  2.80 cm MITRAL VALVE MV Area (PHT): 3.63 cm    SHUNTS MV Decel Time: 209 msec    Systemic Diam: 1.90 cm MV E velocity: 71.30 cm/s MV A velocity: 44.00 cm/s MV E/A ratio:  1.62 Stanly Leavens MD Electronically signed by Stanly Leavens MD Signature Date/Time: 04/17/2024/4:00:16 PM    Final    I, Artist Lloyd, personally (independently) visualized and performed the interpretation of the MRI images attached in this note.     Assessment and Plan: 23 y.o. female with hypermobile Ehlers-Danlos syndrome complicated by neck pain tachycardia and fatigue.  Neck pain: Patient has MRI findings consistent with right C4 cervical radiculopathy.  This fits with her MRI.  We discussed options.  We discussed an epidural steroid injection.  This would be the next logical step especially if physical therapy is not sufficient.  For the time continue PT with a backup plan for ordering an epidural steroid injection if needed.  Tachycardia and POTS: Slightly improved with fluid and salt goals.  Additionally recommend exercises.  We discussed medications.  Propranolol and low-dose intermittently would be a logical choice.  She would like to read a little bit more about propranolol and  metoprolol and will let me know what she thinks.  Happy to prescribe this with a phone call or MyChart message.  Mast cell activation syndrome: Cetirizine  has been a bit helpful.  Encouraged her to start famotidine  and consider adding cromolyn .  Genetic testing: Patient may be a good candidate for genetic testing.  She has a preliminary diagnosis of hypermobile Ehlers-Danlos.  However her appearance may be more typical for classic or even vascular.  We discussed genetic testing.  I provided the information about the [facility] genetic testing and we could proceed with that if needed.  Recheck in 1 month.   PDMP not reviewed this encounter. No orders of the defined types were placed in this encounter.  No orders of the defined types were placed in this encounter.    Discussed warning signs or symptoms. Please see discharge instructions. Patient expresses understanding.   The above documentation has been reviewed and is accurate and complete Artist Lloyd, M.D.

## 2024-05-30 ENCOUNTER — Ambulatory Visit (INDEPENDENT_AMBULATORY_CARE_PROVIDER_SITE_OTHER): Admitting: Family Medicine

## 2024-05-30 VITALS — BP 114/68 | HR 90 | Ht 64.0 in | Wt 143.0 lb

## 2024-05-30 DIAGNOSIS — M357 Hypermobility syndrome: Secondary | ICD-10-CM | POA: Diagnosis not present

## 2024-05-30 DIAGNOSIS — M6281 Muscle weakness (generalized): Secondary | ICD-10-CM | POA: Diagnosis not present

## 2024-05-30 DIAGNOSIS — M5459 Other low back pain: Secondary | ICD-10-CM | POA: Diagnosis not present

## 2024-05-30 DIAGNOSIS — M542 Cervicalgia: Secondary | ICD-10-CM | POA: Diagnosis not present

## 2024-05-30 DIAGNOSIS — Q7962 Hypermobile Ehlers-Danlos syndrome: Secondary | ICD-10-CM | POA: Diagnosis not present

## 2024-05-30 DIAGNOSIS — G90A Postural orthostatic tachycardia syndrome (POTS): Secondary | ICD-10-CM | POA: Diagnosis not present

## 2024-05-30 DIAGNOSIS — M5010 Cervical disc disorder with radiculopathy, unspecified cervical region: Secondary | ICD-10-CM | POA: Insufficient documentation

## 2024-05-30 DIAGNOSIS — D8940 Mast cell activation, unspecified: Secondary | ICD-10-CM

## 2024-05-30 NOTE — Patient Instructions (Addendum)
 Thank you for coming in today.   Start famotidine   Do some research on propranolol ad metoprolol  [facility] Genetic Testing: [email or web address]  Epidural steroid injection  Check back in a month  Let me know if you would like me to prescribe any of these medicines in the meantime.

## 2024-06-05 DIAGNOSIS — F411 Generalized anxiety disorder: Secondary | ICD-10-CM | POA: Diagnosis not present

## 2024-06-06 DIAGNOSIS — M6281 Muscle weakness (generalized): Secondary | ICD-10-CM | POA: Diagnosis not present

## 2024-06-06 DIAGNOSIS — M5459 Other low back pain: Secondary | ICD-10-CM | POA: Diagnosis not present

## 2024-06-06 DIAGNOSIS — M357 Hypermobility syndrome: Secondary | ICD-10-CM | POA: Diagnosis not present

## 2024-06-06 DIAGNOSIS — M542 Cervicalgia: Secondary | ICD-10-CM | POA: Diagnosis not present

## 2024-06-10 ENCOUNTER — Other Ambulatory Visit: Payer: Self-pay

## 2024-06-10 ENCOUNTER — Emergency Department (HOSPITAL_BASED_OUTPATIENT_CLINIC_OR_DEPARTMENT_OTHER): Admitting: Radiology

## 2024-06-10 ENCOUNTER — Emergency Department (HOSPITAL_BASED_OUTPATIENT_CLINIC_OR_DEPARTMENT_OTHER)
Admission: EM | Admit: 2024-06-10 | Discharge: 2024-06-10 | Disposition: A | Discharge status: Home / Self Care | Attending: Emergency Medicine | Admitting: Emergency Medicine

## 2024-06-10 DIAGNOSIS — R55 Syncope and collapse: Secondary | ICD-10-CM

## 2024-06-10 DIAGNOSIS — G90A Postural orthostatic tachycardia syndrome (POTS): Secondary | ICD-10-CM | POA: Diagnosis not present

## 2024-06-10 DIAGNOSIS — Q796 Ehlers-Danlos syndrome, unspecified: Secondary | ICD-10-CM | POA: Diagnosis not present

## 2024-06-10 DIAGNOSIS — R002 Palpitations: Secondary | ICD-10-CM | POA: Diagnosis not present

## 2024-06-10 DIAGNOSIS — R079 Chest pain, unspecified: Secondary | ICD-10-CM | POA: Diagnosis not present

## 2024-06-10 LAB — BASIC METABOLIC PANEL WITH GFR
Anion gap: 9 (ref 5–15)
BUN: 12 mg/dL (ref 6–20)
CO2: 27 mmol/L (ref 22–32)
Calcium: 10.1 mg/dL (ref 8.9–10.3)
Chloride: 105 mmol/L (ref 98–111)
Creatinine, Ser: 0.63 mg/dL (ref 0.44–1.00)
GFR, Estimated: >60 mL/min (ref >60)
Glucose, Bld: 96 mg/dL (ref 70–99)
Potassium: 3.8 mmol/L (ref 3.5–5.1)
Sodium: 140 mmol/L (ref 135–145)

## 2024-06-10 LAB — CBC
HCT: 36.8 % (ref 36.0–46.0)
Hemoglobin: 12.6 g/dL (ref 12.0–15.0)
MCH: 31.1 pg (ref 26.0–34.0)
MCHC: 34.2 g/dL (ref 30.0–36.0)
MCV: 90.9 fL (ref 80.0–100.0)
Platelets: 196 K/uL (ref 150–400)
RBC: 4.05 MIL/uL (ref 3.87–5.11)
RDW: 12.2 % (ref 11.5–15.5)
WBC: 5.8 K/uL (ref 4.0–10.5)
nRBC: 0 % (ref 0.0–0.2)

## 2024-06-10 LAB — HCG, SERUM, QUALITATIVE: Preg, Serum: NEGATIVE

## 2024-06-10 LAB — TSH: TSH: 1.51 u[IU]/mL (ref 0.350–4.500)

## 2024-06-10 LAB — D-DIMER, QUANTITATIVE: D-Dimer, Quant: <0.27 ug{FEU}/mL (ref 0.00–0.50)

## 2024-06-10 LAB — TROPONIN T, HIGH SENSITIVITY
Troponin T High Sensitivity: <15 ng/L (ref 0–19)
Troponin T High Sensitivity: <15 ng/L (ref 0–19)

## 2024-06-10 LAB — MAGNESIUM: Magnesium: 2 mg/dL (ref 1.7–2.4)

## 2024-06-10 NOTE — ED Provider Notes (Addendum)
 [facility] AT [facility] Provider Note   CSN: [id number] Arrival date & time: 06/10/24  1613     Patient presents with: Near syncope vs syncope  Teresa Landry is a 23 y.o. female.     23 year old female with medical history significant for depression, POTS, mast cell activation syndrome, hypermobile Ehlers-Danlos syndrome presenting to the emergency department with an episode of near syncope and heart palpitations.  The patient has previously worn a cardiac monitor outpatient without evidence of cardiac arrhythmia.  She states that she woke up within the past two days and had an episode where it felt like her heart was beating slowly and she experienced significant lightheadedness and felt as if she was going to pass out. She then could feel a painful pounding in her chest which she described as a boom boom boom. She felt as if she lost control of her body and may have lost consciousness, however she was not certain and she was able to recall the full episode to me.  She denies any lower extremity swelling, no recent long travel on planes or aircraft, has not had any cough or hemoptysis.  She is on estrogen containing birth control.  No known history of thyroid  dysfunction.  She is currently asymptomatic but endorses concern and anxiousness regarding what happened and whether it could be life-threatening.  Prior to Admission medications   Medication Sig Start Date End Date Taking? Authorizing Provider  albuterol (VENTOLIN HFA) 108 (90 Base) MCG/ACT inhaler Inhale 2 puffs into the lungs every 6 (six) hours as needed for wheezing. 06/16/24   Teresa Landry, Teresa S, MD  cetirizine  (ZYRTEC ) 10 MG tablet Take 1 tablet (10 mg total) by mouth daily. 04/30/24   Teresa Artist RAMAN, MD  cromolyn  (GASTROCROM ) 100 MG/5ML solution Take 2.5 mLs (50 mg total) by mouth 4 (four) times daily -  before meals and at bedtime. Patient not taking: Reported on 06/30/2024 04/11/24   Teresa Artist RAMAN, MD   famotidine  (PEPCID ) 20 MG tablet Take 1 tablet (20 mg total) by mouth 2 (two) times daily. Patient not taking: Reported on 06/30/2024 04/30/24   Teresa Landry, Teresa S, MD  ibuprofen (ADVIL) 200 MG tablet Take 200 mg by mouth every 6 (six) hours as needed.    [provider]  levonorgestrel-ethinyl estradiol (PORTIA-28) 0.15-30 MG-MCG tablet 1 tablet Orally Once a day    [provider]  MELATONIN PO Take by mouth.    [provider]  metoprolol tartrate (LOPRESSOR) 25 MG tablet Take 0.5 tablets (12.5 mg total) by mouth 2 (two) times daily. Patient not taking: Reported on 06/30/2024 06/16/24   Teresa Landry, Teresa S, MD  tiZANidine (ZANAFLEX) 2 MG tablet Take by mouth as needed for muscle spasms.    [provider]    Allergies: Celecoxib and Meloxicam    Review of Systems  Cardiovascular:  Positive for chest pain and palpitations.  All other systems reviewed and are negative.   Updated Vital Signs BP 117/77   Pulse 66   Temp 98.3 F (36.8 C) (Oral)   Resp 20   SpO2 100%   Physical Exam Vitals and nursing note reviewed.  Constitutional:      General: She is not in acute distress.    Appearance: She is well-developed.  HENT:     Head: Normocephalic and atraumatic.  Eyes:     Conjunctiva/sclera: Conjunctivae normal.  Cardiovascular:     Rate and Rhythm: Normal rate and regular rhythm.  Heart sounds: No murmur heard. Pulmonary:     Effort: Pulmonary effort is normal. No respiratory distress.     Breath sounds: Normal breath sounds.  Abdominal:     Palpations: Abdomen is soft.     Tenderness: There is no abdominal tenderness.  Musculoskeletal:        General: No swelling.     Cervical back: Neck supple.  Skin:    General: Skin is warm and dry.     Capillary Refill: Capillary refill takes less than 2 seconds.  Neurological:     Mental Status: She is alert.     Comments: MENTAL STATUS EXAM:    Orientation: Alert and oriented to person, place and  time.  Memory: Cooperative, follows commands well.  Language: Speech is clear and language is normal.   CRANIAL NERVES:    CN 2 (Optic): Visual fields intact to confrontation.  CN 3,4,6 (EOM): Pupils equal and reactive to light. Full extraocular eye movement without nystagmus.  CN 5 (Trigeminal): Facial sensation is normal, no weakness of masticatory muscles.  CN 7 (Facial): No facial weakness or asymmetry.  CN 8 (Auditory): Auditory acuity grossly normal.  CN 9,10 (Glossophar): The uvula is midline, the palate elevates symmetrically.  CN 11 (spinal access): Normal sternocleidomastoid and trapezius strength.  CN 12 (Hypoglossal): The tongue is midline. No atrophy or fasciculations.[name]   MOTOR:  Muscle Strength: 5/5RUE, 5/5LUE, 5/5RLE, 5/5LLE.   COORDINATION:   Intact finger-to-nose, no tremor.   SENSATION:   Intact to light touch all four extremities.  GAIT: Gait normal without ataxia   Psychiatric:        Mood and Affect: Mood normal.     (all labs ordered are listed, but only abnormal results are displayed) Labs Reviewed  BASIC METABOLIC PANEL WITH GFR  CBC  TSH  T4, FREE  MAGNESIUM  D-DIMER, QUANTITATIVE  HCG, SERUM, QUALITATIVE  TROPONIN T, HIGH SENSITIVITY  TROPONIN T, HIGH SENSITIVITY    EKG: EKG Interpretation Date/Time:  Tuesday June 10 2024 16:28:36 EDT Ventricular Rate:  76 PR Interval:  130 QRS Duration:  92 QT Interval:  352 QTC Calculation: 396 R Axis:   94  Text Interpretation: Normal sinus rhythm Rightward axis Incomplete right bundle branch block Cannot rule out Anterior infarct , age undetermined Abnormal ECG Confirmed by Jerrol Agent ([id number]) on 06/10/2024 9:20:27 PM  Radiology: No results found.    Procedures   Medications Ordered in the ED - No data to display                                  Medical Decision Making Amount and/or Complexity of Data Reviewed Labs: ordered. Radiology: ordered.      23 year old female with  medical history significant for depression, POTS, mast cell activation syndrome, hypermobile Ehlers-Danlos syndrome presenting to the emergency department with an episode of near syncope and heart palpitations.  The patient has previously worn a cardiac monitor outpatient without evidence of cardiac arrhythmia.  She states that she woke up within the past two days and had an episode where it felt like her heart was beating slowly and she experienced significant lightheadedness and felt as if she was going to pass out. She then could feel a painful pounding in her chest which she described as a boom boom boom. She felt as if she lost control of her body and may have lost consciousness, however she was not certain  and she was able to recall the full episode to me.  She denies any lower extremity swelling, no recent long travel on planes or aircraft, has not had any cough or hemoptysis.  She is on estrogen containing birth control.  No known history of thyroid  dysfunction.  She is currently asymptomatic but endorses concern and anxiousness regarding what happened and whether it could be life-threatening.  On arrival, the patient was vitally stable, afebrile, not tachycardic or tachypneic, BP 120/86, saturating 100% on room air.  Physical exam revealed lungs clear to auscultation bilaterally intact symmetric pulses, and abdomen that was soft, nontender, nondistended, no rebound or guarding and a neuroexam that was nonfocal and normal. She has no lower extremity swelling.  She is PERC positive in the setting of her near syncopal episode and palpitations.  What she describes sounds to be likely a vasovagal episode with mild diaphoresis, lightheadedness as well as seemingly low heart rate and near syncope. It is unclear if she fully experienced syncope or near syncope based on her description to me. Does not sound like seizure like activity based on patient description of events and her neurologic exam is  reassuring.  EKG obtained, sinus rhythm, ventricular rate 76, incomplete right bundle branch block, no acute ischemic changes noted, no evidence of WPW, HOCM, Brugada, arrhythmogenic right ventricular dysplasia.  Chest x-ray revealed no acute cardiac or pulmonary abnormality.  Lab evaluation to evaluate for electrolyte abnormality, thyroid  dysfunction, D-dimer to evaluate for PE collected and overall reassuring.  CBC without a leukocytosis or anemia, BMP without electrolyte abnormality, normal renal function, cardiac troponin normal, hCG negative, TSH normal, D-dimer negative.  Patient orthostatics were collected and were ultimately negative.  She appears euvolemic on exam, and is well-appearing with so far a very reassuring workup and presentation.  Unclear etiology of the patient'Landry presentation although episode does sound to be vasovagal in nature.  Recommended outpatient follow-up with a primary care provider to discuss consideration for longer-term cardiac monitor placement.  Return precautions provided, overall stable for discharge.     Final diagnoses:  Palpitations  Vasovagal near syncope  Postural orthostatic tachycardia syndrome (POTS)    ED Discharge Orders     None          Jerrol Agent, MD 06/11/24 1310    Jerrol Agent, MD 07/01/24 1655    Jerrol Agent, MD 07/01/24 1656

## 2024-06-10 NOTE — Discharge Instructions (Addendum)
 Your workup was overall reassuring with reassuring EKG, vitals and chest x-ray, exam and laboratory evaluation.  You could benefit from an outpatient cardiac monitor.

## 2024-06-10 NOTE — ED Triage Notes (Signed)
 C/o increased palpations over the last 2 days. Hx of POTS and Ehlers-Danlos syndrome. Denies CP or SHOB.

## 2024-06-10 NOTE — ED Notes (Signed)
 Ambulated patient, one lap around ER floor, pt reported feeling fine, no dizziness.

## 2024-06-10 NOTE — ED Notes (Signed)
RN provided AVS using Teachback Method. Patient verbalizes understanding of Discharge Instructions. Opportunity for Questioning and Answers were provided by RN. Patient Discharged from ED ambulatory to home with family. ? ?

## 2024-06-11 ENCOUNTER — Encounter: Payer: Self-pay | Admitting: Family Medicine

## 2024-06-11 DIAGNOSIS — G90A Postural orthostatic tachycardia syndrome (POTS): Secondary | ICD-10-CM

## 2024-06-11 DIAGNOSIS — R002 Palpitations: Secondary | ICD-10-CM

## 2024-06-11 LAB — T4, FREE: Free T4: 0.76 ng/dL (ref 0.61–1.12)

## 2024-06-11 NOTE — Telephone Encounter (Signed)
 Patient called and would like for Dr. Joane to take a look at her mychart message as soon as possible. Please advise.

## 2024-06-13 DIAGNOSIS — M5459 Other low back pain: Secondary | ICD-10-CM | POA: Diagnosis not present

## 2024-06-13 DIAGNOSIS — M6281 Muscle weakness (generalized): Secondary | ICD-10-CM | POA: Diagnosis not present

## 2024-06-13 DIAGNOSIS — M357 Hypermobility syndrome: Secondary | ICD-10-CM | POA: Diagnosis not present

## 2024-06-13 DIAGNOSIS — M542 Cervicalgia: Secondary | ICD-10-CM | POA: Diagnosis not present

## 2024-06-16 ENCOUNTER — Ambulatory Visit (INDEPENDENT_AMBULATORY_CARE_PROVIDER_SITE_OTHER): Admitting: Family Medicine

## 2024-06-16 ENCOUNTER — Encounter: Payer: Self-pay | Admitting: Family Medicine

## 2024-06-16 VITALS — BP 102/74 | HR 88 | Ht 64.0 in | Wt 145.0 lb

## 2024-06-16 DIAGNOSIS — R55 Syncope and collapse: Secondary | ICD-10-CM | POA: Diagnosis not present

## 2024-06-16 DIAGNOSIS — R002 Palpitations: Secondary | ICD-10-CM | POA: Diagnosis not present

## 2024-06-16 DIAGNOSIS — G90A Postural orthostatic tachycardia syndrome (POTS): Secondary | ICD-10-CM | POA: Diagnosis not present

## 2024-06-16 MED ORDER — ALBUTEROL SULFATE HFA 108 (90 BASE) MCG/ACT IN AERS: 2.0000 | INHALATION_SPRAY | Freq: Four times a day (QID) | RESPIRATORY_TRACT | 11 refills | Status: AC | PRN

## 2024-06-16 MED ORDER — METOPROLOL TARTRATE 25 MG PO TABS: 12.5000 mg | ORAL_TABLET | Freq: Two times a day (BID) | ORAL | 2 refills | Status: DC

## 2024-06-16 NOTE — Telephone Encounter (Signed)
 Pt was seen this morning to discuss tx options and next steps

## 2024-06-16 NOTE — Progress Notes (Signed)
   I, Leotis Batter, CMA acting as a scribe for Artist Lloyd, MD.  Teresa Landry is a 23 y.o. female who presents to [facility] at [facility] today for exacerbation of some of her hEDS symptoms. Pt was last seen by Dr. Lloyd on 05/30/24 and was advised on exercise and advised to research metoprolol and propranolol.   Pt was seen at the [facility] ED on 10/28 w/ palpitations and a new syncope event. She was later referred to cardiology.  Today, pt reports no new episodes since last Tuesday. Notes sitting down the she started feeling dizzy, started losing peripheral vision, and had ringing in the ears. Unsure if she actually passed out.   Dx testing: 05/19/24 C-spine MRI             04/30/24 C-spine XR 04/17/24 Echocardiogram             04/11/24 EKG  Pertinent review of systems: No fevers or chills  Relevant historical information: Ehlers-Danlos syndrome and dysautonomia/POTS   Exam:  BP 102/74   Pulse 88   Ht 5' 4 (1.626 m)   Wt 145 lb (65.8 kg)   SpO2 98%   BMI 24.89 kg/m  General: Well Developed, well nourished, and in no acute distress.   MSK: Normal gait       Assessment and Plan: 23 y.o. female with palpitations and near syncope likely secondary to Ehlers-Danlos and dysautonomia/POTS.  Patient did have a long-term monitor that was normal for 8 days and did have a echocardiogram that was normal.  We discussed options.  She already is doing a good job with salt and fluids.  Will add low-dose metoprolol. Additionally will refer to cardiology for further help in the future.  She may require a longer term assessment of rhythm.  She developed a rash with the Zio patch so if we need a longer look at her heart rhythm we may need to use something like a loop recorder in the future.  Patient is already scheduled for follow-up on 17 November.  Keep that follow-up appointment.   PDMP not reviewed this encounter. Orders Placed This Encounter  Procedures    Ambulatory referral to Cardiology    Referral Priority:   Routine    Referral Type:   Consultation    Referral Reason:   Specialty Services Required    Number of Visits Requested:   1   Meds ordered this encounter  Medications   metoprolol tartrate (LOPRESSOR) 25 MG tablet    Sig: Take 0.5 tablets (12.5 mg total) by mouth 2 (two) times daily.    Dispense:  30 tablet    Refill:  2   albuterol (VENTOLIN HFA) 108 (90 Base) MCG/ACT inhaler    Sig: Inhale 2 puffs into the lungs every 6 (six) hours as needed for wheezing.    Dispense:  2 each    Refill:  11     Discussed warning signs or symptoms. Please see discharge instructions. Patient expresses understanding.   The above documentation has been reviewed and is accurate and complete Artist Lloyd, M.D.

## 2024-06-16 NOTE — Patient Instructions (Addendum)
 Thank you for coming in today.   Prescribed Metoprolol and Albuterol   Follow up as previously scheduled.

## 2024-06-19 ENCOUNTER — Ambulatory Visit: Admitting: Family Medicine

## 2024-06-19 ENCOUNTER — Telehealth: Payer: Self-pay | Admitting: Physical Therapy

## 2024-06-19 ENCOUNTER — Ambulatory Visit: Attending: Family Medicine | Admitting: Physical Therapy

## 2024-06-19 DIAGNOSIS — M6281 Muscle weakness (generalized): Secondary | ICD-10-CM | POA: Insufficient documentation

## 2024-06-19 DIAGNOSIS — R32 Unspecified urinary incontinence: Secondary | ICD-10-CM | POA: Insufficient documentation

## 2024-06-19 DIAGNOSIS — F411 Generalized anxiety disorder: Secondary | ICD-10-CM | POA: Diagnosis not present

## 2024-06-19 DIAGNOSIS — M357 Hypermobility syndrome: Secondary | ICD-10-CM | POA: Insufficient documentation

## 2024-06-19 DIAGNOSIS — R279 Unspecified lack of coordination: Secondary | ICD-10-CM | POA: Insufficient documentation

## 2024-06-19 NOTE — Telephone Encounter (Signed)
 Patient informed of missed appt this morning, 06/19/24 at 8:00 AM. She thought appt was next Thursday, but she plans to attend next Thursday's session at 11:45 (confirmed).  Celena Domino, PT, DPT 06/19/24 2:15 PM [facility] [address] [place], [place] [address] Phone # [phone] Fax [phone]

## 2024-06-20 ENCOUNTER — Encounter: Payer: Self-pay | Admitting: Family Medicine

## 2024-06-20 DIAGNOSIS — G43909 Migraine, unspecified, not intractable, without status migrainosus: Secondary | ICD-10-CM | POA: Diagnosis not present

## 2024-06-20 DIAGNOSIS — G90A Postural orthostatic tachycardia syndrome (POTS): Secondary | ICD-10-CM

## 2024-06-20 DIAGNOSIS — R002 Palpitations: Secondary | ICD-10-CM

## 2024-06-20 DIAGNOSIS — R55 Syncope and collapse: Secondary | ICD-10-CM

## 2024-06-25 DIAGNOSIS — F411 Generalized anxiety disorder: Secondary | ICD-10-CM | POA: Diagnosis not present

## 2024-06-25 NOTE — Telephone Encounter (Signed)
 Patient called to make sure that Dr Joane saw the message sent on MyChart.

## 2024-06-26 ENCOUNTER — Ambulatory Visit: Admitting: Physical Therapy

## 2024-06-26 DIAGNOSIS — R279 Unspecified lack of coordination: Secondary | ICD-10-CM

## 2024-06-26 DIAGNOSIS — M357 Hypermobility syndrome: Secondary | ICD-10-CM

## 2024-06-26 DIAGNOSIS — M6281 Muscle weakness (generalized): Secondary | ICD-10-CM | POA: Diagnosis not present

## 2024-06-26 DIAGNOSIS — R32 Unspecified urinary incontinence: Secondary | ICD-10-CM | POA: Diagnosis not present
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
## 2024-06-27 DIAGNOSIS — M357 Hypermobility syndrome: Secondary | ICD-10-CM | POA: Diagnosis not present

## 2024-06-27 DIAGNOSIS — M542 Cervicalgia: Secondary | ICD-10-CM | POA: Diagnosis not present

## 2024-06-27 DIAGNOSIS — M6281 Muscle weakness (generalized): Secondary | ICD-10-CM | POA: Diagnosis not present

## 2024-06-27 DIAGNOSIS — M5459 Other low back pain: Secondary | ICD-10-CM | POA: Diagnosis not present

## 2024-06-30 ENCOUNTER — Ambulatory Visit: Admitting: Family Medicine

## 2024-06-30 ENCOUNTER — Encounter: Payer: Self-pay | Admitting: Family Medicine

## 2024-06-30 VITALS — BP 108/66 | HR 82 | Ht 64.0 in | Wt 147.0 lb

## 2024-06-30 DIAGNOSIS — R55 Syncope and collapse: Secondary | ICD-10-CM | POA: Diagnosis not present

## 2024-06-30 DIAGNOSIS — R002 Palpitations: Secondary | ICD-10-CM | POA: Diagnosis not present

## 2024-06-30 DIAGNOSIS — R0789 Other chest pain: Secondary | ICD-10-CM

## 2024-06-30 NOTE — Patient Instructions (Addendum)
 Thank you for coming in today.   cavilon skin prep  Start the metoprolol  Keep the appointment with cardiology.

## 2024-06-30 NOTE — Progress Notes (Signed)
   LILLETTE Ileana Collet, PhD, LAT, ATC acting as a scribe for Artist Lloyd, MD.  Teresa Landry is a 23 y.o. female who presents to [facility] today for f/u hEDS, POT, and palpitations. Pt was last seen by Dr. Lloyd on 06/16/24 and was prescribed metoprolol and referred to cardiology.  Today, pt reports she has an appointment scheduled w/ cardiology in Dec and is on a cancellation wait-list. She notes having a sinus infection last week. She notes continuous strange feeling palpitations, bubbly. She c/o intense L-side of her chest into neck and shoulder that will last 10 minutes. She also notes the cetirizine  has been helpful. Not taking cromolyn  nor metoprolol.   She notes considerable near syncopal episodes.  Of note the last 2 episodes have occurred when she was seated at rest.  She did not feel anxious prior to the episodes.  Pertinent review of systems: No fevers or chills  Relevant historical information: Concern for POTS.  Hypermobile EDS.   Exam:  BP 108/66   Pulse 82   Ht 5' 4 (1.626 m)   Wt 147 lb (66.7 kg)   SpO2 98%   BMI 25.23 kg/m  General: Well Developed, well nourished, and in no acute distress.   MSK: Normal cervical motion    Lab and Radiology Results No results found for this or any previous visit (from the past 72 hours). No results found.     Assessment and Plan: 23 y.o. female with near syncope and chest pain.  Etiology is unclear.  Patient had an echocardiogram that was generally normal in September and a 4-day attempt at a long-term monitor in October that showed sinus tachycardia but no dangerous rhythms.  Of note she was unable to complete the long-term monitor as the adhesive caused a lot of skin irritation.  Additionally she did not have the her current symptoms during that monitor.  Patient will see a cardiologist on 10 December.  We are discussing options.  I am going to communicate with her cardiologist about if the  cardiologist would like me to order testing before that upcoming visit.  On our end I do think taking the metoprolol now is a good idea and likely to help.  She will consider it.  Additionally will refer to neurology.  Patient may benefit from EEG.  This does not sound seizure like to be but patient may benefit from further evaluation. Check back with me in about a month.   PDMP not reviewed this encounter. Orders Placed This Encounter  Procedures   Ambulatory referral to Neurology    Referral Priority:   Routine    Referral Type:   Consultation    Referral Reason:   Specialty Services Required    Requested Specialty:   Neurology    Number of Visits Requested:   1   No orders of the defined types were placed in this encounter.    Discussed warning signs or symptoms. Please see discharge instructions. Patient expresses understanding.   The above documentation has been reviewed and is accurate and complete Artist Lloyd, M.D.

## 2024-07-01 ENCOUNTER — Encounter (HOSPITAL_BASED_OUTPATIENT_CLINIC_OR_DEPARTMENT_OTHER): Payer: Self-pay | Admitting: Emergency Medicine

## 2024-07-01 DIAGNOSIS — F411 Generalized anxiety disorder: Secondary | ICD-10-CM | POA: Diagnosis not present

## 2024-07-02 DIAGNOSIS — F411 Generalized anxiety disorder: Secondary | ICD-10-CM | POA: Diagnosis not present

## 2024-07-03 ENCOUNTER — Ambulatory Visit: Admitting: Physical Therapy

## 2024-07-04 DIAGNOSIS — M542 Cervicalgia: Secondary | ICD-10-CM | POA: Diagnosis not present

## 2024-07-04 DIAGNOSIS — M6281 Muscle weakness (generalized): Secondary | ICD-10-CM | POA: Diagnosis not present

## 2024-07-04 DIAGNOSIS — M5459 Other low back pain: Secondary | ICD-10-CM | POA: Diagnosis not present

## 2024-07-04 DIAGNOSIS — M357 Hypermobility syndrome: Secondary | ICD-10-CM | POA: Diagnosis not present

## 2024-07-09 ENCOUNTER — Ambulatory Visit: Admitting: Physical Therapy

## 2024-07-09 DIAGNOSIS — M6281 Muscle weakness (generalized): Secondary | ICD-10-CM

## 2024-07-09 DIAGNOSIS — R279 Unspecified lack of coordination: Secondary | ICD-10-CM

## 2024-07-09 DIAGNOSIS — F411 Generalized anxiety disorder: Secondary | ICD-10-CM | POA: Diagnosis not present

## 2024-07-09 DIAGNOSIS — R32 Unspecified urinary incontinence: Secondary | ICD-10-CM

## 2024-07-09 DIAGNOSIS — M357 Hypermobility syndrome: Secondary | ICD-10-CM
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
## 2024-07-15 ENCOUNTER — Telehealth: Payer: Self-pay

## 2024-07-15 NOTE — Telephone Encounter (Signed)
 Copied from CRM #[id number]. Topic: Clinical - Medication Prior Auth >> Jul 14, 2024  2:50 PM Alfonso ORN wrote: Reason for CRM: [facility] pre service center stated pt needs prior auth for radiology [id number] ext [phone]  . Stated was to send to Joane Birmingham who submitted for CT

## 2024-07-15 NOTE — Telephone Encounter (Signed)
 I called the pre auth center left a voicemail for them letting them know that as far as I am aware [facility] does not require a pre-authorization for CT, MRI, or US .  Did let them know that if I am wrong I need a CPT code in order to make sure I authorize the correct CT scan as this is not something we order often.   This was a CRM with a phone number and an extension but I have no Idea how to get back to it for that number and extension.   The name of the auth center person I left the voicemail for was Andrea Finder.

## 2024-07-16 DIAGNOSIS — F411 Generalized anxiety disorder: Secondary | ICD-10-CM | POA: Diagnosis not present

## 2024-07-17 ENCOUNTER — Telehealth (HOSPITAL_COMMUNITY): Payer: Self-pay | Admitting: Emergency Medicine

## 2024-07-17 NOTE — Telephone Encounter (Signed)
 Attempted to call patient regarding upcoming cardiac CT appointment. Left message on voicemail with name and callback number Rockwell Alexandria RN Navigator Cardiac Imaging [facility] [phone] Office [phone] Cell

## 2024-07-18 ENCOUNTER — Ambulatory Visit (HOSPITAL_COMMUNITY)
Admission: RE | Admit: 2024-07-18 | Discharge: 2024-07-18 | Disposition: A | Source: Ambulatory Visit | Attending: Family Medicine | Admitting: Family Medicine

## 2024-07-18 DIAGNOSIS — M542 Cervicalgia: Secondary | ICD-10-CM | POA: Diagnosis not present

## 2024-07-18 DIAGNOSIS — M357 Hypermobility syndrome: Secondary | ICD-10-CM | POA: Diagnosis not present

## 2024-07-18 DIAGNOSIS — M6281 Muscle weakness (generalized): Secondary | ICD-10-CM | POA: Diagnosis not present

## 2024-07-18 DIAGNOSIS — R0789 Other chest pain: Secondary | ICD-10-CM

## 2024-07-18 DIAGNOSIS — M5459 Other low back pain: Secondary | ICD-10-CM | POA: Diagnosis not present

## 2024-07-22 ENCOUNTER — Encounter (HOSPITAL_COMMUNITY): Payer: Self-pay

## 2024-07-23 ENCOUNTER — Ambulatory Visit (HOSPITAL_COMMUNITY)
Admission: RE | Admit: 2024-07-23 | Discharge: 2024-07-23 | Disposition: A | Discharge status: Home / Self Care | Source: Ambulatory Visit | Attending: Family Medicine | Admitting: Family Medicine

## 2024-07-23 ENCOUNTER — Ambulatory Visit
Attending: Student in an Organized Health Care Education/Training Program | Admitting: Student in an Organized Health Care Education/Training Program

## 2024-07-23 ENCOUNTER — Encounter: Payer: Self-pay | Admitting: Student in an Organized Health Care Education/Training Program

## 2024-07-23 VITALS — BP 100/62 | HR 65 | Ht 63.0 in | Wt 142.0 lb

## 2024-07-23 DIAGNOSIS — G90A Postural orthostatic tachycardia syndrome (POTS): Secondary | ICD-10-CM | POA: Diagnosis not present

## 2024-07-23 DIAGNOSIS — R0789 Other chest pain: Secondary | ICD-10-CM | POA: Diagnosis present

## 2024-07-23 DIAGNOSIS — R55 Syncope and collapse: Secondary | ICD-10-CM | POA: Insufficient documentation

## 2024-07-23 DIAGNOSIS — F411 Generalized anxiety disorder: Secondary | ICD-10-CM | POA: Diagnosis not present

## 2024-07-23 MED ORDER — NITROGLYCERIN 0.4 MG SL SUBL
0.8000 mg | SUBLINGUAL_TABLET | Freq: Once | SUBLINGUAL | Status: AC
  Administered 2024-07-23: 0.8 mg via SUBLINGUAL

## 2024-07-23 MED ORDER — IOHEXOL 350 MG/ML SOLN
100.0000 mL | Freq: Once | INTRAVENOUS | Status: AC | PRN
  Administered 2024-07-23: 100 mL via INTRAVENOUS

## 2024-07-23 NOTE — Progress Notes (Addendum)
 Cardiology Office Note:   Date:  07/23/2024  ID:  Teresa Landry, DOB 2001/04/20, MRN [id number] PCP: Pcp, No  [facility] Providers Cardiologist:  Georganna Archer, MD { Chief Complaint:  Chief Complaint  Patient presents with   Loss of Consciousness      History of Present Illness:   Teresa Landry is a 23 y.o. female with a PMH of Ehlers-Danlos syndrome, POTS and vaping who presents as a new patient referral by Dr. Artist Lloyd for the evaluation of chest pain and syncope.  Teresa Landry having experienced 2 episodes of syncope this year for the first time.  The first episode occurred in September when she was laying down in bed in the dark room and she developed a prodrome of tunnel vision, sensation of chest pressure, ringing in her ears and palpitations and then lost consciousness.  She woke up roughly 1 minute later and felt like her heart was beating irregularly and like it was  going to stop.  She again had another episode of syncope 1 month later in October where she was sitting in class and she had a similar prodrome that ultimately resulted in her slumping over in her chair.  She never hit the ground.  She had an echocardiogram performed in August which showed preserved LV and RV systolic function without valvular disease.  She wore a heart monitor for 8 days which was largely unremarkable.  Her symptoms were associated with sinus rhythm and sinus tachycardia.  Unfortunately she cannot wear the monitor for more than 8 days due to development of a pruritic rash.  Since onset of the syncopal events she is experience daily symptoms of palpitations, but has not had syncope again.  Her prodromal symptoms also occur on a daily basis, but are much more mild than when she passed out.  She has no significant family history of cardiovascular disease.  She endorses vaping, but denies alcohol and illicit drug use.  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  She was diagnosed clinically  with POTS by her PCP and encouraged to have adequate hydration.  She was started on metoprolol  to tartrate for symptomatic management.  She has not been on it very long so is uncertain if it has had benefit or not.  She is otherwise physically active without issue.  Orthostatic VS in clinic today:  Lying: HR 62 bpm, BP 99/65 Sitting: HR 78 bpm, BP 100/67 Standing: HR 76 bpm, BP 107/72 Standing 3 minutes: HR 84 bpm, BP 101/69   Past Medical History:  Diagnosis Date   Depression    Scoliosis      Studies Reviewed:    EKG: I independently reviewed the patient's last ECG and it demonstrates NSR with incomplete RBBB       Cardiac Studies & Procedures   ______________________________________________________________________________________________     ECHOCARDIOGRAM  ECHOCARDIOGRAM COMPLETE 04/17/2024  Narrative ECHOCARDIOGRAM REPORT    Patient Name:   Teresa Landry Date of Exam: 04/17/2024 Medical Rec #:  [id number]    Height:       64.0 in Accession #:    [id number]   Weight:       138.6 lb Date of Birth:  [birth year]    BSA:          1.674 m Patient Age:    23 years     BP:           110/78 mmHg Patient Gender: F  HR:           70 bpm. Exam Location:  Outpatient  Procedure: 2D Echo, Cardiac Doppler, Color Doppler and Strain Analysis (Both Spectral and Color Flow Doppler were utilized during procedure).  Indications:    Dilated Cardiomyopathy  History:        Patient has no prior history of Echocardiogram examinations. Ehlers Danlos,POTS.  Sonographer:    Therisa Crouch Referring Phys: ARTIST GORMAN LLOYD  IMPRESSIONS   1. Left ventricular ejection fraction, by estimation, is 60 to 65%. The left ventricle has normal function. The left ventricle has no regional wall motion abnormalities. Left ventricular diastolic parameters were normal. The average left ventricular global longitudinal strain is -21.5 %. The global longitudinal strain is normal. 2. Right ventricular  systolic function is normal. The right ventricular size is normal. 3. The mitral valve is normal in structure. No evidence of mitral valve regurgitation. No evidence of mitral stenosis. 4. The aortic valve is tricuspid. Aortic valve regurgitation is trivial. No aortic stenosis is present. 5. The inferior vena cava is normal in size with greater than 50% respiratory variability, suggesting right atrial pressure of 3 mmHg.  Comparison(s): No prior Echocardiogram.  Conclusion(s)/Recommendation(s): Normal biventricular function without evidence of hemodynamically significant valvular heart disease.  FINDINGS Left Ventricle: Left ventricular ejection fraction, by estimation, is 60 to 65%. The left ventricle has normal function. The left ventricle has no regional wall motion abnormalities. The average left ventricular global longitudinal strain is -21.5 %. Strain was performed and the global longitudinal strain is normal. The left ventricular internal cavity size was normal in size. There is no left ventricular hypertrophy. Left ventricular diastolic parameters were normal.  Right Ventricle: The right ventricular size is normal. No increase in right ventricular wall thickness. Right ventricular systolic function is normal.  Left Atrium: Left atrial size was normal in size.  Right Atrium: Right atrial size was normal in size.  Pericardium: There is no evidence of pericardial effusion.  Mitral Valve: The mitral valve is normal in structure. No evidence of mitral valve regurgitation. No evidence of mitral valve stenosis.  Tricuspid Valve: The tricuspid valve is normal in structure. Tricuspid valve regurgitation is not demonstrated. No evidence of tricuspid stenosis.  Aortic Valve: The aortic valve is tricuspid. Aortic valve regurgitation is trivial. No aortic stenosis is present.  Pulmonic Valve: The pulmonic valve was not well visualized. Pulmonic valve regurgitation is not visualized. No evidence  of pulmonic stenosis.  Aorta: The aortic root, ascending aorta and aortic arch are all structurally normal, with no evidence of dilitation or obstruction.  Venous: The inferior vena cava is normal in size with greater than 50% respiratory variability, suggesting right atrial pressure of 3 mmHg.  IAS/Shunts: The atrial septum is grossly normal.   LEFT VENTRICLE PLAX 2D LVIDd:         4.70 cm     Diastology LVIDs:         3.10 cm     LV e' medial:    16.10 cm/s LV PW:         0.80 cm     LV E/e' medial:  4.4 LV IVS:        0.70 cm     LV e' lateral:   21.30 cm/s LVOT diam:     1.90 cm     LV E/e' lateral: 3.3 LVOT Area:     2.84 cm 2D Longitudinal Strain 2D Strain GLS Avg:     -21.5 % LV  Volumes (MOD) LV vol d, MOD A2C: 94.4 ml LV vol d, MOD A4C: 92.9 ml LV vol s, MOD A2C: 30.8 ml LV vol s, MOD A4C: 32.6 ml LV SV MOD A2C:     63.6 ml LV SV MOD A4C:     92.9 ml LV SV MOD BP:      63.3 ml  RIGHT VENTRICLE             IVC RV Basal diam:  3.10 cm     IVC diam: 2.00 cm RV S prime:     13.90 cm/s TAPSE (M-mode): 2.6 cm  LEFT ATRIUM             Index        RIGHT ATRIUM          Index LA diam:        2.50 cm 1.49 cm/m   RA Area:     7.91 cm LA Vol (A2C):   32.0 ml 19.12 ml/m  RA Volume:   13.70 ml 8.18 ml/m LA Vol (A4C):   33.9 ml 20.25 ml/m LA Biplane Vol: 35.9 ml 21.45 ml/m  AORTA Ao Root diam: 2.50 cm Ao Asc diam:  2.80 cm  MITRAL VALVE MV Area (PHT): 3.63 cm    SHUNTS MV Decel Time: 209 msec    Systemic Diam: 1.90 cm MV E velocity: 71.30 cm/s MV A velocity: 44.00 cm/s MV E/A ratio:  1.62  Stanly Leavens MD Electronically signed by Stanly Leavens MD Signature Date/Time: 04/17/2024/4:00:16 PM    Final    MONITORS  LONG TERM MONITOR (3-14 DAYS) 05/21/2024  Narrative Patch Wear Time:  8 days and 9 hours  HR 50 - 168, average 90 bpm. No atrial fibrillation detected. Rare supraventricular ectopy. Rare ventricular ectopy. No sustained  arrhythmias. Symptom trigger episodes correspond to sinus rhythm and sinus tachycardia   [facility] Cardiac Electrophysiology       ______________________________________________________________________________________________      Risk Assessment/Calculations:              Physical Exam:     VS:  BP 100/62   Pulse 65   Ht 5' 3 (1.6 m)   Wt 142 lb (64.4 kg)   SpO2 99%   BMI 25.15 kg/m      Wt Readings from Last 3 Encounters:  06/30/24 147 lb (66.7 kg)  06/16/24 145 lb (65.8 kg)  05/30/24 143 lb (64.9 kg)     GEN: Well nourished, well developed, in no acute distress NECK: No JVD; No carotid bruits CARDIAC: RRR, no murmurs, rubs, gallops RESPIRATORY:  Clear to auscultation without rales, wheezing or rhonchi  ABDOMEN: Soft, non-tender, non-distended, normal bowel sounds EXTREMITIES:  Warm and well perfused, no edema; No deformity, 2+ radial pulses PSYCH: Normal mood and affect   Assessment & Plan Syncope, unspecified syncope type - Patient presenting for the evaluation of 2 episodes of syncope and ongoing palpitations. - Complete echocardiogram and truncated heart monitor results were unremarkable. - The patient underwent coronary CT angiogram earlier today and I reviewed the patient's images.  No evidence of obstructive CAD or anomalous coronaries. - I think the differential for her syncope includes arrhythmia vs neurologic process vs vasovagal syncope - Her presentation is not really classic for any of the after forementioned diagnoses. - I gave the patient several options including 1) wearing a 30-day heart monitor to see if she has another syncopal event, 2) placement of an ILR for longer-term monitoring given the  infrequent nature of her syncope, or 3) watchful waiting.  With shared decision making we elected to pursue ILR placement. - Additionally, I will reach out to her PCP to see if neurology referral is reasonable as well. EP referral for ILR  placement Consider neurology referral Encouraged maintaining adequate hydration Follow-up in 6 months POTS (postural orthostatic tachycardia syndrome) - Diagnosed with POTS previously, but the patient's orthostatics today go against this diagnosis.  Granted, she is on a beta-blocker so that may be truncating her heart rate response. - Regardless, I do not think that POTS is contributing to her prior syncopal events. - Will CTM          This note was written with the assistance of a dictation microphone or AI dictation software. Please excuse any typos or grammatical errors.   Signed, Georganna Archer, MD 07/23/2024 1:26 PM    [facility]

## 2024-07-23 NOTE — Assessment & Plan Note (Signed)
-   Patient presenting for the evaluation of 2 episodes of syncope and ongoing palpitations. - Complete echocardiogram and truncated heart monitor results were unremarkable. - The patient underwent coronary CT angiogram earlier today and I reviewed the patient's images.  No evidence of obstructive CAD or anomalous coronaries. - I think the differential for her syncope includes arrhythmia vs neurologic process vs vasovagal syncope - Her presentation is not really classic for any of the after forementioned diagnoses. - I gave the patient several options including 1) wearing a 30-day heart monitor to see if she has another syncopal event, 2) placement of an ILR for longer-term monitoring given the infrequent nature of her syncope, or 3) watchful waiting.  With shared decision making we elected to pursue ILR placement. - Additionally, I will reach out to her PCP to see if neurology referral is reasonable as well. EP referral for ILR placement Consider neurology referral Encouraged maintaining adequate hydration Follow-up in 6 months

## 2024-07-23 NOTE — Assessment & Plan Note (Signed)
-   Diagnosed with POTS previously, but the patient's orthostatics today go against this diagnosis.  Granted, she is on a beta-blocker so that may be truncating her heart rate response. - Regardless, I do not think that POTS is contributing to her prior syncopal events. - Will CTM

## 2024-07-23 NOTE — Patient Instructions (Addendum)
 REFERRAL TO ELECTROPHYSIOLOGY   Follow-Up: At [facility], you and your health needs are our priority.  As part of our continuing mission to provide you with exceptional heart care, our providers are all part of one team.  This team includes your primary Cardiologist (physician) and Advanced Practice Providers or APPs (Physician Assistants and Nurse Practitioners) who all work together to provide you with the care you need, when you need it.  Your next appointment:   6 month(s)  Provider:   Georganna Archer, MD     Other Instructions  [employer]  [email or web address]

## 2024-07-25 DIAGNOSIS — M357 Hypermobility syndrome: Secondary | ICD-10-CM | POA: Diagnosis not present

## 2024-07-25 DIAGNOSIS — M5459 Other low back pain: Secondary | ICD-10-CM | POA: Diagnosis not present

## 2024-07-25 DIAGNOSIS — M542 Cervicalgia: Secondary | ICD-10-CM | POA: Diagnosis not present

## 2024-07-25 DIAGNOSIS — M6281 Muscle weakness (generalized): Secondary | ICD-10-CM | POA: Diagnosis not present

## 2024-07-30 ENCOUNTER — Ambulatory Visit: Admitting: Family Medicine

## 2024-07-30 ENCOUNTER — Ambulatory Visit (INDEPENDENT_AMBULATORY_CARE_PROVIDER_SITE_OTHER)

## 2024-07-30 ENCOUNTER — Ambulatory Visit: Payer: Self-pay | Admitting: Family Medicine

## 2024-07-30 VITALS — BP 110/60 | HR 92 | Ht 63.0 in | Wt 140.8 lb

## 2024-07-30 DIAGNOSIS — R55 Syncope and collapse: Secondary | ICD-10-CM | POA: Diagnosis not present

## 2024-07-30 DIAGNOSIS — M25561 Pain in right knee: Secondary | ICD-10-CM

## 2024-07-30 DIAGNOSIS — Q7962 Hypermobile Ehlers-Danlos syndrome: Secondary | ICD-10-CM

## 2024-07-30 DIAGNOSIS — M25571 Pain in right ankle and joints of right foot: Secondary | ICD-10-CM

## 2024-07-30 DIAGNOSIS — M79671 Pain in right foot: Secondary | ICD-10-CM

## 2024-07-30 DIAGNOSIS — G8929 Other chronic pain: Secondary | ICD-10-CM | POA: Diagnosis not present

## 2024-07-30 DIAGNOSIS — F411 Generalized anxiety disorder: Secondary | ICD-10-CM | POA: Diagnosis not present

## 2024-07-30 MED ORDER — CETIRIZINE HCL 10 MG PO TABS: 10.0000 mg | ORAL_TABLET | Freq: Every day | ORAL | 3 refills | Status: AC

## 2024-07-30 NOTE — Progress Notes (Signed)
 No evidence of aneurysm.  Will review results on recheck.

## 2024-07-30 NOTE — Patient Instructions (Signed)
 Thank you for coming in today.   Please get an Xray today before you leave   Order's placed for MRI's, will see you back for MRI review.

## 2024-07-30 NOTE — Progress Notes (Unsigned)
° °  I, Claretha Schimke am a scribe for Dr. Artist Lloyd, MD.  Teresa Landry is a 23 y.o. female who presents to [facility] at [facility] today for  f/u hEDS, near syncope, POT, and palpitations. Pt was last seen by Dr. Lloyd on 06/30/24 and in coordination w/ her cardiology a CT was ordered. Also referred to neurology.  Today, pt reports that she is ok today. Cardiology: been on the metoprolol  and it seems to be helping. Wants to talk to the doctor about other results from cardiology. Having some weird stuff going on with her eye. Got new prescription in the glasses and they are helping the lazy eye issue but when she isn't wearing the glasses at night it feels like the eye is rolling around on its on. Eye pressure and headache in the same eye.   Still interested in getting the MRI of the knee and ankle/foot that was discussed previously.   Pertinent review of systems: ***  Relevant historical information: ***   Exam:  There were no vitals taken for this visit. General: Well Developed, well nourished, and in no acute distress.   MSK: ***    Lab and Radiology Results No results found for this or any previous visit (from the past 72 hours). No results found.     Assessment and Plan: 23 y.o. female with ***   PDMP not reviewed this encounter. No orders of the defined types were placed in this encounter.  No orders of the defined types were placed in this encounter.    Discussed warning signs or symptoms. Please see discharge instructions. Patient expresses understanding.   ***

## 2024-07-31 ENCOUNTER — Ambulatory Visit: Payer: Self-pay | Admitting: Family Medicine

## 2024-07-31 DIAGNOSIS — F411 Generalized anxiety disorder: Secondary | ICD-10-CM | POA: Diagnosis not present

## 2024-07-31 NOTE — Progress Notes (Signed)
Right ankle x-ray looks okay to radiology.

## 2024-07-31 NOTE — Progress Notes (Signed)
Right foot x-ray looks okay to radiology.

## 2024-07-31 NOTE — Addendum Note (Signed)
 Addended by: GEROME ASHLEY SAUNDERS on: 07/31/2024 01:49 PM   Modules accepted: Orders

## 2024-07-31 NOTE — Progress Notes (Signed)
Right knee x-ray looks okay to radiology.

## 2024-08-01 DIAGNOSIS — M5459 Other low back pain: Secondary | ICD-10-CM | POA: Diagnosis not present

## 2024-08-01 DIAGNOSIS — M6281 Muscle weakness (generalized): Secondary | ICD-10-CM | POA: Diagnosis not present

## 2024-08-01 DIAGNOSIS — M542 Cervicalgia: Secondary | ICD-10-CM | POA: Diagnosis not present

## 2024-08-01 DIAGNOSIS — M357 Hypermobility syndrome: Secondary | ICD-10-CM | POA: Diagnosis not present

## 2024-08-04 NOTE — Telephone Encounter (Signed)
 Forwarding to Dr. Denyse Amass.

## 2024-08-04 NOTE — Progress Notes (Signed)
 Coronary artery study looks okay to me.  When are you seeing cardiology?

## 2024-08-11 DIAGNOSIS — F411 Generalized anxiety disorder: Secondary | ICD-10-CM | POA: Diagnosis not present

## 2024-08-19 ENCOUNTER — Ambulatory Visit
Admission: RE | Admit: 2024-08-19 | Discharge: 2024-08-19 | Disposition: A | Discharge status: Home / Self Care | Source: Ambulatory Visit | Attending: Family Medicine | Admitting: Family Medicine

## 2024-08-19 ENCOUNTER — Ambulatory Visit
Admission: RE | Admit: 2024-08-19 | Discharge: 2024-08-19 | Disposition: A | Source: Ambulatory Visit | Attending: Family Medicine | Admitting: Family Medicine

## 2024-08-19 DIAGNOSIS — G8929 Other chronic pain: Secondary | ICD-10-CM

## 2024-08-19 DIAGNOSIS — M79671 Pain in right foot: Secondary | ICD-10-CM

## 2024-08-25 NOTE — Progress Notes (Unsigned)
"       ° °  LILLETTE Ileana Collet, PhD, LAT, ATC acting as a scribe for Artist Lloyd, MD.  Teresa Landry is a 24 y.o. female who presents to [facility] at [facility] today for f/u R foot, R knee, and R ankle pain in the setting of hEDS. Pt was last seen by Dr. Lloyd on 07/30/24 and was advised to cont her current regimen and MRI's were ordered.  Today, pt reports ***  Dx testing: 08/19/24 R ankle, R foot, & R knee MRI  07/30/24 R ankle, R foot, & R knee XR  Pertinent review of systems: ***  Relevant historical information: ***   Exam:  LMP 07/02/2024 (Approximate)  General: Well Developed, well nourished, and in no acute distress.   MSK: ***    Lab and Radiology Results No results found for this or any previous visit (from the past 72 hours). No results found.     Assessment and Plan: 24 y.o. female with ***   PDMP not reviewed this encounter. No orders of the defined types were placed in this encounter.  No orders of the defined types were placed in this encounter.    Discussed warning signs or symptoms. Please see discharge instructions. Patient expresses understanding.   ***  "

## 2024-08-26 ENCOUNTER — Encounter: Payer: Self-pay | Admitting: Family Medicine

## 2024-08-26 ENCOUNTER — Ambulatory Visit: Admitting: Family Medicine

## 2024-08-26 VITALS — BP 122/74 | HR 82 | Ht 63.0 in | Wt 140.0 lb

## 2024-08-26 DIAGNOSIS — M79671 Pain in right foot: Secondary | ICD-10-CM

## 2024-08-26 DIAGNOSIS — H579 Unspecified disorder of eye and adnexa: Secondary | ICD-10-CM

## 2024-08-26 DIAGNOSIS — G8929 Other chronic pain: Secondary | ICD-10-CM

## 2024-08-26 DIAGNOSIS — M25571 Pain in right ankle and joints of right foot: Secondary | ICD-10-CM | POA: Diagnosis not present

## 2024-08-26 DIAGNOSIS — R569 Unspecified convulsions: Secondary | ICD-10-CM

## 2024-08-26 DIAGNOSIS — Q7962 Hypermobile Ehlers-Danlos syndrome: Secondary | ICD-10-CM

## 2024-08-26 DIAGNOSIS — M7918 Myalgia, other site: Secondary | ICD-10-CM

## 2024-08-26 MED ORDER — PROPRANOLOL HCL ER 60 MG PO CP24: 60.0000 mg | ORAL_CAPSULE | Freq: Every day | ORAL | 2 refills | Status: AC

## 2024-08-26 NOTE — Patient Instructions (Addendum)
 Thank you for coming in today.   A new referral has been placed to [facility] Neurology. You should hear soon about scheduling.  I've placed a referral to [facility] neuro-optometry  I've referred you to Physical Therapy.  Let us  know if you don't hear from them in one week.   Let me know how the propranolol  works for you  Check back in 2 weeks

## 2024-08-26 NOTE — Therapy (Incomplete)
 " OUTPATIENT PHYSICAL THERAPY LOWER EXTREMITY EVALUATION   Patient Name: Teresa Landry MRN: [id number] DOB:[birth year], 24 y.o., female Today's Date: 08/26/2024  END OF SESSION:   Past Medical History:  Diagnosis Date   Depression    Scoliosis    No past surgical history on file. Patient Active Problem List   Diagnosis Date Noted   Syncope 07/23/2024   Cervical disc disorder with radiculopathy of cervical region 05/30/2024   POTS (postural orthostatic tachycardia syndrome) 04/11/2024   Mast cell activation syndrome 04/11/2024   Hypermobile Ehlers-Danlos syndrome 02/19/2024   Myofascial pain 02/19/2024   Lower extremity numbness 08/28/2023   Other idiopathic scoliosis, lumbar region 08/28/2023   Anxiety 12/01/2020   Chronic fatigue syndrome 12/01/2020   Tonsil stone 12/01/2020   Family history of epilepsy in paternal grandfather 12/01/2020   Spells of decreased attentiveness 12/01/2020   Jerking movements of extremities 12/01/2020   Tremor observed on examination 12/01/2020    PCP: None   REFERRING PROVIDER: Joane Birmingham, MD  REFERRING DIAG:  [phone] (ICD-10-CM) - Chronic pain of right ankle  M79.671 (ICD-10-CM) - Right foot pain  Q79.62 (ICD-10-CM) - Hypermobile Ehlers-Danlos syndrome  M79.18 (ICD-10-CM) - Myofascial pain    THERAPY DIAG:  No diagnosis found.  Rationale for Evaluation and Treatment: Rehabilitation  ONSET DATE: ***  SUBJECTIVE:   SUBJECTIVE STATEMENT: From MD note on 08/26/24:  [name] is a 24 y.o. female who presents to [facility] at [facility] today for f/u R foot, R knee, and R ankle pain in the setting of hEDS.   Right foot and ankle pain.  Pain is located medial and lateral ankle and it plantar metatarsal heads and fifth metatarsal.  Right knee pain pain is located predominantly at anterior knee.  PERTINENT HISTORY: EDS, POTS, depression, scoliosis, anxiety,   PAIN: 08/27/24 Are you having pain? Yes: NPRS scale:  *** Pain location: *** Pain description: *** Aggravating factors: *** Relieving factors: ***  PRECAUTIONS: Other: EDS, POTS  RED FLAGS: None   WEIGHT BEARING RESTRICTIONS: No  FALLS:  Has patient fallen in last 6 months? {fallsyesno:27318}  LIVING ENVIRONMENT: Lives with: {OPRC lives with:25569::lives with their family} Lives in: {Lives in:25570} Stairs: {opstairs:27293} Has following equipment at home: {Assistive devices:23999}  OCCUPATION: ***  PLOF: Independent, Vocation/Vocational requirements: ***, and Leisure: ***  PATIENT GOALS: ***  NEXT MD VISIT: 09/10/24  OBJECTIVE:  Note: Objective measures were completed at Evaluation unless otherwise noted.  DIAGNOSTIC FINDINGS:  07/30/24: X-ray of knee and ankle on Rt: normal MRI complete 08/19/24 without result  PATIENT SURVEYS:  08/27/24: LEFS:   COGNITION: Overall cognitive status: {cognition:24006}     SENSATION: {sensation:27233}  EDEMA:  {edema:24020}  MUSCLE LENGTH: Hamstrings: Right *** deg; Left *** deg Debby test: Right *** deg; Left *** deg  POSTURE: {posture:25561}  PALPATION: ***  LOWER EXTREMITY ROM:  {AROM/PROM:27142} ROM Right eval Left eval  Hip flexion    Hip extension    Hip abduction    Hip adduction    Hip internal rotation    Hip external rotation    Knee flexion    Knee extension    Ankle dorsiflexion    Ankle plantarflexion    Ankle inversion    Ankle eversion     (Blank rows = not tested)  LOWER EXTREMITY MMT:  MMT Right eval Left eval  Hip flexion    Hip extension    Hip abduction    Hip adduction    Hip internal rotation  Hip external rotation    Knee flexion    Knee extension    Ankle dorsiflexion    Ankle plantarflexion    Ankle inversion    Ankle eversion     (Blank rows = not tested)  LOWER EXTREMITY SPECIAL TESTS:  {LEspecialtests:26242}  FUNCTIONAL TESTS:  {Functional tests:24029}  GAIT: Distance walked: *** Assistive device  utilized: {Assistive devices:23999} Level of assistance: {Levels of assistance:24026} Comments: ***                                                                                                                                TREATMENT DATE:  08/27/24: Findings from evaluation discussed, pt educated on plan of care, HEP initiated.      PATIENT EDUCATION:  Education details: *** Person educated: Patient Education method: Programmer, Multimedia, Facilities Manager, and Handouts Education comprehension: verbalized understanding and returned demonstration  HOME EXERCISE PROGRAM: ***  ASSESSMENT:  CLINICAL IMPRESSION: Patient is a 24 y.o. female who was seen today for physical therapy evaluation and treatment for Rt knee and foot pain.  Pt has EDS and POTs as contributing factors.    OBJECTIVE IMPAIRMENTS: {opptimpairments:25111}.   ACTIVITY LIMITATIONS: {activitylimitations:27494}  PARTICIPATION LIMITATIONS: {participationrestrictions:25113}  PERSONAL FACTORS: {Personal factors:25162} are also affecting patient's functional outcome.   REHAB POTENTIAL: Good  CLINICAL DECISION MAKING: Evolving/moderate complexity  EVALUATION COMPLEXITY: Moderate   GOALS: Goals reviewed with patient? Yes  SHORT TERM GOALS: Target date: *** Be independent in initial HEP Baseline: Goal status: INITIAL  2.  *** Baseline:  Goal status: INITIAL  3.  *** Baseline:  Goal status: INITIAL  4.  *** Baseline:  Goal status: INITIAL  5.  *** Baseline:  Goal status: INITIAL  6.  *** Baseline:  Goal status: INITIAL  LONG TERM GOALS: Target date: ***  Be independent in advanced HEP Baseline:  Goal status: INITIAL  2.  Improve LEFS to > or = to  Baseline:  Goal status: INITIAL  3.  *** Baseline:  Goal status: INITIAL  4.  *** Baseline:  Goal status: INITIAL  5.  *** Baseline:  Goal status: INITIAL  6.  *** Baseline:  Goal status: INITIAL   PLAN:  PT FREQUENCY: 2x/week  PT  DURATION: 8 weeks  PLANNED INTERVENTIONS: 97110-Therapeutic exercises, 97530- Therapeutic activity, 97112- Neuromuscular re-education, 97535- Self Care, 02859- Manual therapy, [id number]- Gait training, [phone]- Canalith repositioning, [id number]- Aquatic Therapy, [id number]- Electrical stimulation (manual), [id number]- Vasopneumatic device, [id number]- Ionotophoresis 4mg /ml Dexamethasone, 79439 (1-2 muscles), 20561 (3+ muscles)- Dry Needling, Patient/Family education, Stair training, Taping, Vestibular training, Cryotherapy, and Moist heat  PLAN FOR NEXT SESSION: PIERRETTE Burnard Joy, PT 08/26/2024 4:33 PM   [facility] [address], [address] [place], [place] [address] Phone # [phone] Fax [phone]   "

## 2024-08-27 ENCOUNTER — Ambulatory Visit: Attending: Family Medicine

## 2024-08-28 ENCOUNTER — Ambulatory Visit: Admitting: Internal Medicine

## 2024-08-29 NOTE — Progress Notes (Signed)
 Right ankle MRI shows some swelling in the midfoot which could produce some pain.

## 2024-08-29 NOTE — Progress Notes (Signed)
 Right foot MRI shows some swelling in the midfoot.  Will talk about this further at follow-up.

## 2024-08-29 NOTE — Progress Notes (Signed)
 Right knee MRI does not show any meniscus tears or arthritis.  I did talk about your situation with the orthopedic surgeon.  We could try an injection.  Will talk about this further at follow-up on the 28th.

## 2024-09-03 ENCOUNTER — Other Ambulatory Visit: Payer: Self-pay

## 2024-09-03 ENCOUNTER — Ambulatory Visit: Attending: Family Medicine | Admitting: Physical Therapy

## 2024-09-03 DIAGNOSIS — M5459 Other low back pain: Secondary | ICD-10-CM | POA: Insufficient documentation

## 2024-09-03 DIAGNOSIS — Q7962 Hypermobile Ehlers-Danlos syndrome: Secondary | ICD-10-CM | POA: Diagnosis present

## 2024-09-03 DIAGNOSIS — M6281 Muscle weakness (generalized): Secondary | ICD-10-CM | POA: Insufficient documentation

## 2024-09-03 DIAGNOSIS — R252 Cramp and spasm: Secondary | ICD-10-CM | POA: Diagnosis present

## 2024-09-03 DIAGNOSIS — G8929 Other chronic pain: Secondary | ICD-10-CM | POA: Diagnosis present

## 2024-09-03 DIAGNOSIS — M25571 Pain in right ankle and joints of right foot: Secondary | ICD-10-CM | POA: Insufficient documentation

## 2024-09-03 DIAGNOSIS — M7918 Myalgia, other site: Secondary | ICD-10-CM | POA: Insufficient documentation

## 2024-09-03 DIAGNOSIS — M79671 Pain in right foot: Secondary | ICD-10-CM | POA: Insufficient documentation

## 2024-09-03 DIAGNOSIS — R29898 Other symptoms and signs involving the musculoskeletal system: Secondary | ICD-10-CM | POA: Diagnosis present

## 2024-09-03 NOTE — Therapy (Signed)
 " OUTPATIENT PHYSICAL THERAPY LOWER EXTREMITY EVALUATION   Patient Name: Teresa Landry MRN: [id number] DOB:2001/08/13, 24 y.o., female Today's Date: 09/03/2024  END OF SESSION:  PT End of Session - 09/03/24 0854     Visit Number 1    Date for Recertification  10/29/24    Authorization Type BCBS/medicaid united healthcare auth for [facility]    PT Start Time [phone]    PT Stop Time 0931    PT Time Calculation (min) 37 min    Activity Tolerance Patient tolerated treatment well    Behavior During Therapy [facility] for tasks assessed/performed          Past Medical History:  Diagnosis Date   Depression    Scoliosis    No past surgical history on file. Patient Active Problem List   Diagnosis Date Noted   Syncope 07/23/2024   Cervical disc disorder with radiculopathy of cervical region 05/30/2024   POTS (postural orthostatic tachycardia syndrome) 04/11/2024   Mast cell activation syndrome 04/11/2024   Hypermobile Ehlers-Danlos syndrome 02/19/2024   Myofascial pain 02/19/2024   Lower extremity numbness 08/28/2023   Other idiopathic scoliosis, lumbar region 08/28/2023   Anxiety 12/01/2020   Chronic fatigue syndrome 12/01/2020   Tonsil stone 12/01/2020   Family history of epilepsy in paternal grandfather 12/01/2020   Spells of decreased attentiveness 12/01/2020   Jerking movements of extremities 12/01/2020   Tremor observed on examination 12/01/2020    PCP: Pcp, No   REFERRING PROVIDER: Joane Artist RAMAN, MD   REFERRING DIAG:  [phone] (ICD-10-CM) - Chronic pain of right ankle  M79.671 (ICD-10-CM) - Right foot pain  Q79.62 (ICD-10-CM) - Hypermobile Ehlers-Danlos syndrome  M79.18 (ICD-10-CM) - Myofascial pain    THERAPY DIAG:  Other low back pain  Other symptoms and signs involving the musculoskeletal system  Muscle weakness (generalized)  Cramp and spasm  Pain in right ankle and joints of right foot  Rationale for Evaluation and Treatment: Rehabilitation  ONSET DATE:  10 years  SUBJECTIVE:   SUBJECTIVE STATEMENT:  I'm having full body pain due to EDS. Worst of it is in the low back. I have severe scoliosis. My right ankle has been hurting about 2-3 years. When sitting for a long time my hips hurt and get numb, right is worse and comes on quicker. Pretty bad neck issues for the past year. I do yoga at least 2x/wk. 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 Some numbness in legs with prolonged sitting.  PERTINENT HISTORY: EDS, small bakers cyst R knee PAIN:  Are you having pain? Yes: NPRS scale: 9-10/10 at worst depending on the day Pain location: entire back and into hips Pain description: stiff and sore in low back, dull/tingly ache at times, sometimes sharp Aggravating factors: prolonged sitting, standing and lifting things Relieving factors: slow stretching, ibuprofen sometimes helps  PRECAUTIONS: None  RED FLAGS: None   WEIGHT BEARING RESTRICTIONS: No  FALLS:  Has patient fallen in last 6 months? No  OCCUPATION: student, pet sitting  PLOF: Independent  PATIENT GOALS: figure out how to manage the pain better  NEXT MD VISIT: next week  OBJECTIVE:  Note: Objective measures were completed at Evaluation unless otherwise noted.  DIAGNOSTIC FINDINGS:  MRI ankle IMPRESSION: 1. Intact medial and lateral ankle ligaments and tendons. 2. No stress fracture or osteochondral abnormality. 3. Small midfoot joint effusions of uncertain significance.   MRI Foot IMPRESSION: 1. Midfoot and MTP joint effusions. No definite erosive findings but could  not exclude an early inflammatory process. Recommend correlation with inflammatory markers, rheumatoid factor, etc. 2. No acute bony findings. 3. No muscle tear or myositis.  PATIENT SURVEYS:  MODIFIED OSWESTRY DISABILITY SCALE  Date: 09/03/24 Score  Pain intensity 4 =  Pain medication provides me with little relief from pain.  2. Personal care (washing, dressing, etc.) 3 =  I  need help, but I am able to manage most of my personal care.  3. Lifting 3 = Pain prevents me from lifting heavy weights, but I can manage light to medium weights if they are conveniently positioned  4. Walking 3 =  Pain prevents me from walking more than  mile.  5. Sitting 3 =  Pain prevents me from sitting more than  hour.  6. Standing 2 =  Pain prevents me from standing more than 1 hour  7. Sleeping 3 =  Even when I take pain medication, I sleep less than 4 hours.  8. Social Life 3 =  Pain prevents me from going out very often.  9. Traveling 3 = My pain restricts my travel over 1 hour  10. Employment/ Homemaking 2 = I can perform most of my homemaking/job duties, but pain prevents me from performing more physically stressful activities (eg, lifting, vacuuming).  Total 29/50   Interpretation of scores: Score Category Description  0-20% Minimal Disability The patient can cope with most living activities. Usually no treatment is indicated apart from advice on lifting, sitting and exercise  21-40% Moderate Disability The patient experiences more pain and difficulty with sitting, lifting and standing. Travel and social life are more difficult and they may be disabled from work. Personal care, sexual activity and sleeping are not grossly affected, and the patient can usually be managed by conservative means  41-60% Severe Disability Pain remains the main problem in this group, but activities of daily living are affected. These patients require a detailed investigation  61-80% Crippled Back pain impinges on all aspects of the patients life. Positive intervention is required  81-100% Bed-bound These patients are either bed-bound or exaggerating their symptoms  Bluford FORBES Zoe DELENA Karon DELENA, et al. Surgery versus conservative management of stable thoracolumbar fracture: the PRESTO feasibility RCT. [place] ([place]): [employer]; 2021 Nov. [facility] Technology Assessment, No. 25.62.) Appendix  3, Oswestry Disability Index category descriptors. Available from: Findjewelers.cz  Minimally Clinically Important Difference (MCID) = 12.8%     COGNITION: Overall cognitive status: Within functional limits for tasks assessed     SENSATION: Not tested  MUSCLE LENGTH: Tightness in R>L HS   POSTURE: R convexity scoliotic curve with subsequent depressed R shoulder and scapula and elevated L illium  PALPATION: Palpation: TTP at B lumbar and gluteals. Increased tissue tension in R lumbar due to scoliotic curve  LUMBAR ROM: WNL painful with L SB and R rotation. LEROM: WNL some pain in B ant hip with IR/ADD and tightness with ER   LOWER EXTREMITY MMT: hip flexion cogwheels  MMT Right eval Left eval  Hip flexion 4+ 4+  Hip extension 4+ 4+  Hip abduction 4   Hip adduction 5   Hip internal rotation    Hip external rotation    Knee flexion 4+ 4+  Knee extension 5 5  Ankle dorsiflexion 5   Ankle plantarflexion    Ankle inversion 5   Ankle eversion 5*pain    (Blank rows = not tested)   FUNCTIONAL TESTS: 5 times sit to stand: 10.73 sec  TREATMENT DATE:   09/03/24  See pt ed and HEP    PATIENT EDUCATION:  Education details: PT eval findings, anticipated POC, initial HEP, and fee schedule for TPDN and lack of insurance coverage requiring payment at time of service   Person educated: Patient Education method: Explanation, Demonstration, Tactile cues, Verbal cues, and Handouts Education comprehension: verbalized understanding and returned demonstration  HOME EXERCISE PROGRAM: Access Code: [email or web address] URL: [email or web address] Date: 09/03/2024 Prepared by: Mliss  Exercises - Supine Transversus Abdominis Bracing - Hands on Stomach  - 2 x daily - 7 x weekly - 1 sets - 10 reps - 5 sec hold - Supine March  -  1 x daily - 7 x weekly - 2 sets - 10 reps - Plank on Knees  - 1 x daily - 7 x weekly - 1 sets - 5 reps - max hold hold  ASSESSMENT:  CLINICAL IMPRESSION: Patient is a 24 y.o. female who was seen today for physical therapy evaluation and treatment for overall body pain secondary to EDS. Her main complaint today was back pain. Pain is throughout her entire back. She also has B hip pain, neck pain and R ankle pain. She reports intermittent numbness into B LE with prolonged sitting as well. Her ODI  indicates moderate disability. Pain and deficits affect her ability to perform ADLS. She will benefit from skilled PT to address these deficits and those listed below.   .   OBJECTIVE IMPAIRMENTS: decreased activity tolerance, decreased strength, increased muscle spasms, impaired flexibility, impaired sensation, postural dysfunction, and pain.   ACTIVITY LIMITATIONS: lifting, bending, sitting, standing, sleeping, bathing, dressing, hygiene/grooming, and locomotion level  PARTICIPATION LIMITATIONS: meal prep, cleaning, laundry, driving, shopping, community activity, and occupation  PERSONAL FACTORS: Time since onset of injury/illness/exacerbation and 1 comorbidity: Ehler's Danlos Syndrome are also affecting patient's functional outcome.   REHAB POTENTIAL: Good  CLINICAL DECISION MAKING: Evolving/moderate complexity  EVALUATION COMPLEXITY: Moderate   GOALS: Goals reviewed with patient? Yes  SHORT TERM GOALS: Target date: 10/01/2024   Patient will be independent with initial HEP.  Baseline:  Goal status: INITIAL  2.  Patient will report decrease in overall pain by 30% with ADLS Baseline:  Goal status: INITIAL  3.  Patient able to perform a full plank for 20 sec showing improved core strength. Baseline:  Goal status: INITIAL   LONG TERM GOALS: Target date: 10/29/2024   Patient will be independent with advanced/ongoing HEP to improve outcomes and carryover.  Baseline:  Goal status:  INITIAL  2.  Patient will report 75% improvement in pain with ADLs to improve QOL.  Baseline:  Goal status: INITIAL  3.  Patient to demonstrate improved hip flexor strength to 5/5.   Baseline:  Goal status: INITIAL  4.   Patient will score 23 or less on the Modified Oswestry demonstrating improved functional ability.  Baseline: 29 Goal status: INITIAL      PLAN:  PT FREQUENCY: 2x/week  PT DURATION: 8 weeks  PLANNED INTERVENTIONS: 97110-Therapeutic exercises, 97530- Therapeutic activity, 97112- Neuromuscular re-education, 97535- Self Care, 02859- Manual therapy, [phone]- Gait training, [phone]- Aquatic Therapy, [phone]- Electrical stimulation (unattended), [phone]- Electrical stimulation (manual), [id number]- Vasopneumatic device, [id number]- Ultrasound, [id number]- Ionotophoresis 4mg /ml Dexamethasone, 79439 (1-2 muscles), 20561 (3+ muscles)- Dry Needling, Patient/Family education, Balance training, Stair training, Taping, Joint mobilization, Cryotherapy, and Moist heat  PLAN FOR NEXT SESSION: Review and progress HEP, focus on core strength for EDS and hip flexor strength, try cupping for back pain and possibly DN at some point.  Mliss Cummins, PT 09/03/24 1:00 PM   "

## 2024-09-09 ENCOUNTER — Ambulatory Visit: Admitting: Physical Therapy

## 2024-09-09 NOTE — Progress Notes (Unsigned)
"       ° °  LILLETTE Ileana Collet, PhD, LAT, ATC acting as a scribe for Artist Lloyd, MD.  Teresa Landry is a 24 y.o. female who presents to [facility] at [facility] today for f/u R foot, R knee, and R ankle pain in the setting of hEDS. Pt was last seen by Dr. Lloyd on 08/26/24 and was referred to neuro-ophthalmology, neurology, and switched to propranolol .   Today, pt reports ***  Dx testing: 08/19/24 R ankle, R foot, & R knee MRI             07/30/24 R ankle, R foot, & R knee XR  Pertinent review of systems: ***  Relevant historical information: ***   Exam:  There were no vitals taken for this visit. General: Well Developed, well nourished, and in no acute distress.   MSK: ***    Lab and Radiology Results No results found for this or any previous visit (from the past 72 hours). No results found.     Assessment and Plan: 24 y.o. female with ***   PDMP not reviewed this encounter. No orders of the defined types were placed in this encounter.  No orders of the defined types were placed in this encounter.    Discussed warning signs or symptoms. Please see discharge instructions. Patient expresses understanding.   ***  "

## 2024-09-10 ENCOUNTER — Ambulatory Visit: Admitting: Family Medicine

## 2024-09-11 ENCOUNTER — Ambulatory Visit: Admitting: Physical Therapy

## 2024-09-15 ENCOUNTER — Ambulatory Visit: Admitting: Physical Therapy

## 2024-09-16 ENCOUNTER — Ambulatory Visit: Admitting: Neurology

## 2024-09-16 ENCOUNTER — Encounter: Payer: Self-pay | Admitting: Neurology

## 2024-09-16 ENCOUNTER — Ambulatory Visit: Admitting: Family Medicine

## 2024-09-16 VITALS — BP 90/60 | HR 75 | Ht 63.0 in | Wt 145.0 lb

## 2024-09-16 VITALS — Ht 63.0 in | Wt 146.0 lb

## 2024-09-16 DIAGNOSIS — G9332 Myalgic encephalomyelitis/chronic fatigue syndrome: Secondary | ICD-10-CM

## 2024-09-16 DIAGNOSIS — G894 Chronic pain syndrome: Secondary | ICD-10-CM | POA: Diagnosis not present

## 2024-09-16 DIAGNOSIS — Q7962 Hypermobile Ehlers-Danlos syndrome: Secondary | ICD-10-CM

## 2024-09-16 DIAGNOSIS — R55 Syncope and collapse: Secondary | ICD-10-CM | POA: Diagnosis not present

## 2024-09-16 DIAGNOSIS — R6889 Other general symptoms and signs: Secondary | ICD-10-CM | POA: Diagnosis not present

## 2024-09-16 DIAGNOSIS — M79671 Pain in right foot: Secondary | ICD-10-CM

## 2024-09-16 DIAGNOSIS — R002 Palpitations: Secondary | ICD-10-CM

## 2024-09-16 DIAGNOSIS — G90A Postural orthostatic tachycardia syndrome (POTS): Secondary | ICD-10-CM

## 2024-09-16 DIAGNOSIS — R41840 Attention and concentration deficit: Secondary | ICD-10-CM

## 2024-09-16 NOTE — Patient Instructions (Addendum)
 Thank you for coming in today.   I've referred you to Pulmonology.  Let us  know if you don't hear from them in one week.   Let me know if you would like me to prescribed low-dose Naltrexone or Vyvanse  Let me know if you would like a steroid injection  Check back in 1 month

## 2024-09-16 NOTE — Progress Notes (Signed)
 [facility]  Headache Patient   Provider:  Dedra Gores, MD  Referring Provider: Joane Artist RAMAN, MD Primary Care Physician:  Joane Artist RAMAN, MD  Chief Complaint  Patient presents with   RM 1      Patient is here for near syncopal episodes, eval for epilepsy - Patient states having vision and balance issues and passed out twice. Does not feel it is epilepsy    Other    Has been having issues with her sleep, notice that she stops breathing and it wakes her up before getting into a deep sleep     HPI:  Teresa Landry is a 24 y.o. female and seen here on 09/16/2024 upon referral from Dr. Joane for a Consultation/ Evaluation of fainting spells, headaches, and she has visual changes. She has ben diagnosed with Mertha- Danlos syndrome and is followed by sports medicine.  Teresa Landry is a  female who presents to [facility]  today for f/u passing out spells, near syncope.  And described 2 spells -  vision black out, ears ringing, POTS, and palpitations. Pt was last seen by Dr. Joane on 06/16/24 and was prescribed metoprolol  and referred to cardiology. Patient had an echocardiogram that was generally normal in September and a 4-day attempt at a long-term monitor in October that showed sinus tachycardia but no dangerous rhythms.  Of note she was unable to complete the long-term monitor as the adhesive caused a lot of skin irritation.  Additionally she did not have the her current symptoms during that monitor.   The first spell was in September, while resting in bed, dozing off when she experienced a sudden  sense of doom.  She sat up, felt her chest not moving,  believed there was no heartbeat. Vision changed and blacked out, hearing  impaired by tinnitus, she fell back ( she was seated on her bed ) and waited the spell out.   One spell at school , in October  , while seated, feeling relaxed, she was hydrated and had breakfast, had decent  sleep the night before when she experienced difficulties  breathing, felt  the wind knocked out  and noted a slow and loud heartbeat, irregular.  Her vision changed , closed in and became completely black.  Her ears were ringing, a swooshing sound.  She was unable to speak, to hear , to see but not LOC. This lasted 30 seconds and her classmates didn't notice anything.      History of TBI, injuries, radiation, infection. ; NONE  Nearly every day, 5 days a week. Chronic migraine. She sometimes can sleep them off, a nap of 30 minutes gives sometimes relief.  Has not kept a Journal.   Family history of migraine or other headaches: mother with migraine   Stereotypical headache course described: ( unrelated to her presyncopal episodes).   Pressure behind the eyes, usually both sides.  Radiation to the back and neck.  She gets nauseated and photophobia.  Sounds can be unbearable,  smells are not a trigger.    Triggers known: none specified.  Stress, not weather, not menstrual periods.     Failed abortive therapies: tylenol .   Failed preventive therapies:   betablocker   This patient reports onset of pre syncope  over a period of 6 months, since September 2025 .      I have seen this patient in April 2022: Chief concern according to patient :   I have been having symptoms since [employer],  age 67  when I first had spells of confusion and desorientation, some trembling, some vision changes, twitching, some staring.    I have the pleasure of seeing Teresa Landry today, a right -handed patient with a PMhx of  GAD, anxiety disorder, Fatigue Syndrome, Presents from ER for 2 wk long hx of episodes that ? Is seizure like. She describes that intermittently a sensation comes over her out of no where and its like a buzzine/electric shock feeling throughout her body. She has noted there to be twitching and jerking motions. Denies LOC during the event but after the event she is very lethargic, brain fog and confused having to reorient herself.    PGF has history of seizures. The pt indicated that she has had some of this occur throughout her life but this is first it has occurred back to back. She states that the longest time she has gone without having some form of episode is 2 hrs during this 14 day span. She was clearly advised not to drive by ED and PCP, but has not complied. EEG and MRI have not been completed   Review of Systems: Out of a complete 14 system review, the patient complains of only the following symptoms, and all other reviewed systems are negative.  Tinnitus,  blacked -out vision, irregular heart rhythm and palpitation.   ESS 7  FSS 63 / 63   Social History   Socioeconomic History   Marital status: Single    Spouse name: Not on file   Number of children: Not on file   Years of education: Not on file   Highest education level: Not on file  Occupational History   Not on file  Tobacco Use   Smoking status: Some Days    Types: E-cigarettes    Passive exposure: Never   Smokeless tobacco: Never  Vaping Use   Vaping status: Every Day  Substance and Sexual Activity   Alcohol use: Never   Drug use: Not Currently    Types: Marijuana    Comment: last used 11/30/20   Sexual activity: Not on file  Other Topics Concern   Not on file  Social History Narrative   No caffeine    Social Drivers of Health   Tobacco Use: High Risk (09/16/2024)   Patient History    Smoking Tobacco Use: Some Days    Smokeless Tobacco Use: Never    Passive Exposure: Never  Financial Resource Strain: Not on file  Food Insecurity: Not on file  Transportation Needs: Not on file  Physical Activity: Not on file  Stress: Not on file  Social Connections: Not on file  Intimate Partner Violence: Not on file  Depression (EYV7-0): Not on file  Alcohol Screen: Not on file  Housing: Not on file  Utilities: Not on file  Health Literacy: Not on file    Family History  Problem Relation Age of Onset   Migraines Mother    Healthy  Father    Seizures Paternal Grandfather    Stroke Neg Hx    Sleep apnea Neg Hx     Past Medical History:  Diagnosis Date   Depression    Scoliosis     History reviewed. No pertinent surgical history.  Current Outpatient Medications  Medication Sig Dispense Refill   acetaminophen  (TYLENOL ) 500 MG tablet Take 500 mg by mouth every 6 (six) hours as needed for mild pain (pain score 1-3) or moderate pain (pain score 4-6).     albuterol  (VENTOLIN   HFA) 108 (90 Base) MCG/ACT inhaler Inhale 2 puffs into the lungs every 6 (six) hours as needed for wheezing. 2 each 11   cetirizine  (ZYRTEC ) 10 MG tablet Take 1 tablet (10 mg total) by mouth daily. 90 tablet 3   ibuprofen (ADVIL) 200 MG tablet Take 200 mg by mouth every 6 (six) hours as needed.     levonorgestrel-ethinyl estradiol (PORTIA-28) 0.15-30 MG-MCG tablet 1 tablet Orally Once a day     MELATONIN PO Take by mouth.     propranolol  ER (INDERAL  LA) 60 MG 24 hr capsule Take 1 capsule (60 mg total) by mouth daily. 30 capsule 2   tiZANidine (ZANAFLEX) 2 MG tablet Take by mouth as needed for muscle spasms. (Patient not taking: Reported on 09/16/2024)     No current facility-administered medications for this visit.    Allergies as of 09/16/2024 - Review Complete 09/16/2024  Allergen Reaction Noted   Celecoxib Other (See Comments) 02/19/2024   Meloxicam Other (See Comments) 02/19/2024    Vitals: BP 110/74 (Cuff Size: Normal)   Pulse 69   Ht 5' 3 (1.6 m)   Wt 146 lb (66.2 kg)   BMI 25.86 kg/m   @Orthostatic  VS for the past 24 hrs:  BP- Lying Pulse- Lying BP- Sitting Pulse- Sitting BP- Standing at 0 minutes Pulse- Standing at 0 minutes  09/16/24 0958 97/59 72 113/73 72 121/78 76       @VITALSLAST3  [[id number]]@ Physical exam:  General: The patient is awake, alert and appears not in acute distress.  The patient is well groomed. Head: Normocephalic, atraumatic.  Neck is supple.  Neck circumference:16 Cardiovascular:  Regular rate and  palpable peripheral pulse:  Respiratory: clear to auscultation. , Skin:  Without evidence of edema, or rash Trunk: BMI 25.    Neurologic exam : The patient is awake and alert, oriented to place and time.   Memory subjective described as intact.  There is a normal attention span & concentration ability.  Speech is fluent without  dysarthria, dysphonia or aphasia.  Mood and affect are appropriate.  Cranial nerves: Pupils are equal and briskly reactive to light.  Funduscopic exam without evidence of pallor or edema.  Extraocular movements  in vertical and horizontal planes intact and without nystagmus. Visual fields by finger perimetry are intact. Hearing to finger rub intact.  Facial sensation intact to fine touch. Facial motor strength is symmetric and tongue and uvula move midline.  Motor exam:   Normal tone and normal muscle bulk and symmetric normal strength in all extremities. Over- stretchable  joints.  Grip Strength equal  Proximal strength of shoulder muscles and hip flexors was intact.  Sensory:  Fine touch and vibration were tested .  Proprioception was tested in the upper extremities only and was  normal.  Coordination: Rapid alternating movements in the fingers/hands were normal.  Finger-to-nose maneuver was tested and showed no evidence of ataxia, dysmetria or tremor.  Gait and station: Patient walked with/ without assistive device .  Core Strength within normal limits.  Stance is stable and of wide/ normal. Base.  Tandem gait is intact, turns with  Steps are unfragmented.  Romberg testing  - no swaying. no dizziness upon standing up .   Deep tendon reflexes: in the  upper and lower extremities are symmetric and  brisk without Clonus. Babinski maneuver response is  downgoing.   Assessment: Total time for face to face interview and examination, for review of  images and laboratory testing, neurophysiology testing and  pre-existing records, including out-of -network  , was 45 minutes. Assessment is as follows here:  1)   pre-syncope, near syncope-  with palpitation and changes in vision and hearing- not related to headaches.    Normal orthostatic  results of BP and HR.  2)   long standing  fatigue syndrome.   3)   Ehlers Danlos syndrome, reportedly no lens displacement found by ophthalmology.   Plan:  Treatment plan and additional workup planned after today includes:   1)  I will follow Dr Virgilio request for an EEG and ordered this study today.  If abnormal,  will schedule a revisit.   2)  awaiting  cardiology loop recorder data once implanted, I believe this is not a CNS disorder. [name]    PRN RV.   Helpful Websites: www.AmericanHeadacheSociety.org patenthood.ch www.headaches.org tightmarket.nl    The patient's condition requires frequent monitoring and adjustments in the treatment plan, reflecting the ongoing complexity of care.  This provider is the continuing focal point for all needed services for this condition.   Dedra Gores, MD  [facility] Board certified by The [employer] of Sleep Medicine and Diplomate of the [employer] of Sleep Medicine. Board certified In Neurology through the ABPN, Fellow of the [employer] of Neurology.

## 2024-09-17 ENCOUNTER — Ambulatory Visit: Attending: Family Medicine | Admitting: Physical Therapy

## 2024-09-17 ENCOUNTER — Ambulatory Visit: Admitting: Pulmonary Disease

## 2024-09-17 DIAGNOSIS — M25571 Pain in right ankle and joints of right foot: Secondary | ICD-10-CM

## 2024-09-17 DIAGNOSIS — R262 Difficulty in walking, not elsewhere classified: Secondary | ICD-10-CM

## 2024-09-17 DIAGNOSIS — M6281 Muscle weakness (generalized): Secondary | ICD-10-CM

## 2024-09-17 DIAGNOSIS — R252 Cramp and spasm: Secondary | ICD-10-CM

## 2024-09-17 DIAGNOSIS — M357 Hypermobility syndrome: Secondary | ICD-10-CM

## 2024-09-17 DIAGNOSIS — R293 Abnormal posture: Secondary | ICD-10-CM

## 2024-09-17 DIAGNOSIS — M5459 Other low back pain: Secondary | ICD-10-CM

## 2024-09-17 NOTE — Therapy (Signed)
 " OUTPATIENT PHYSICAL THERAPY LOWER EXTREMITY EVALUATION   Patient Name: Teresa Landry MRN: [id number] DOB:[birth year], 24 y.o., female Today's Date: 09/17/2024  END OF SESSION:  PT End of Session - 09/17/24 0844     Visit Number 2    Number of Visits 6    Date for Recertification  10/29/24    Authorization Type BCBS/medicaid united healthcare auth for [facility]    PT Start Time [phone]    PT Stop Time 0933    PT Time Calculation (min) 49 min    Activity Tolerance Patient tolerated treatment well    Behavior During Therapy [facility] for tasks assessed/performed          Past Medical History:  Diagnosis Date   Depression    Scoliosis    History reviewed. No pertinent surgical history. Patient Active Problem List   Diagnosis Date Noted   Syncope 07/23/2024   Cervical disc disorder with radiculopathy of cervical region 05/30/2024   POTS (postural orthostatic tachycardia syndrome) 04/11/2024   Mast cell activation syndrome 04/11/2024   Hypermobile Ehlers-Danlos syndrome 02/19/2024   Myofascial pain 02/19/2024   Lower extremity numbness 08/28/2023   Other idiopathic scoliosis, lumbar region 08/28/2023   Anxiety 12/01/2020   Chronic fatigue syndrome 12/01/2020   Tonsil stone 12/01/2020   Family history of epilepsy in paternal grandfather 12/01/2020   Spells of decreased attentiveness 12/01/2020   Jerking movements of extremities 12/01/2020   Tremor observed on examination 12/01/2020    PCP: Joane Artist RAMAN, MD   REFERRING PROVIDER: Joane Artist RAMAN, MD   REFERRING DIAG:  [phone] (ICD-10-CM) - Chronic pain of right ankle  M79.671 (ICD-10-CM) - Right foot pain  Q79.62 (ICD-10-CM) - Hypermobile Ehlers-Danlos syndrome  M79.18 (ICD-10-CM) - Myofascial pain    THERAPY DIAG:  Other low back pain  Muscle weakness (generalized)  Cramp and spasm  Pain in right ankle and joints of right foot  Musculoskeletal hypermobility  Difficulty in walking, not elsewhere  classified  Abnormal posture  Rationale for Evaluation and Treatment: Rehabilitation  ONSET DATE: 10 years  SUBJECTIVE:   SUBJECTIVE STATEMENT:  Patient reports she is really tired this morning.  My neck is hurting the worst today.    PERTINENT HISTORY: EDS, small bakers cyst R knee PAIN:  09/17/24 Are you having pain? Yes: NPRS scale: 3-4/10  Pain location: entire back and into hips Pain description: stiff and sore in low back, dull/tingly ache at times, sometimes sharp Aggravating factors: prolonged sitting, standing and lifting things Relieving factors: slow stretching, ibuprofen sometimes helps  PRECAUTIONS: None  RED FLAGS: None   WEIGHT BEARING RESTRICTIONS: No  FALLS:  Has patient fallen in last 6 months? No  OCCUPATION: student, pet sitting  PLOF: Independent  PATIENT GOALS: figure out how to manage the pain better  NEXT MD VISIT: next week  OBJECTIVE:  Note: Objective measures were completed at Evaluation unless otherwise noted.  DIAGNOSTIC FINDINGS:  MRI ankle IMPRESSION: 1. Intact medial and lateral ankle ligaments and tendons. 2. No stress fracture or osteochondral abnormality. 3. Small midfoot joint effusions of uncertain significance.   MRI Foot IMPRESSION: 1. Midfoot and MTP joint effusions. No definite erosive findings but could not exclude an early inflammatory process. Recommend correlation with inflammatory markers, rheumatoid factor, etc. 2. No acute bony findings. 3. No muscle tear or myositis.  PATIENT SURVEYS:  MODIFIED OSWESTRY DISABILITY SCALE  Date: 09/03/24 Score  Pain intensity 4 =  Pain medication provides me with little relief from pain.  2. Personal  care (washing, dressing, etc.) 3 =  I need help, but I am able to manage most of my personal care.  3. Lifting 3 = Pain prevents me from lifting heavy weights, but I can manage light to medium weights if they are conveniently positioned  4. Walking 3 =  Pain prevents me from  walking more than  mile.  5. Sitting 3 =  Pain prevents me from sitting more than  hour.  6. Standing 2 =  Pain prevents me from standing more than 1 hour  7. Sleeping 3 =  Even when I take pain medication, I sleep less than 4 hours.  8. Social Life 3 =  Pain prevents me from going out very often.  9. Traveling 3 = My pain restricts my travel over 1 hour  10. Employment/ Homemaking 2 = I can perform most of my homemaking/job duties, but pain prevents me from performing more physically stressful activities (eg, lifting, vacuuming).  Total 29/50   Interpretation of scores: Score Category Description  0-20% Minimal Disability The patient can cope with most living activities. Usually no treatment is indicated apart from advice on lifting, sitting and exercise  21-40% Moderate Disability The patient experiences more pain and difficulty with sitting, lifting and standing. Travel and social life are more difficult and they may be disabled from work. Personal care, sexual activity and sleeping are not grossly affected, and the patient can usually be managed by conservative means  41-60% Severe Disability Pain remains the main problem in this group, but activities of daily living are affected. These patients require a detailed investigation  61-80% Crippled Back pain impinges on all aspects of the patients life. Positive intervention is required  81-100% Bed-bound These patients are either bed-bound or exaggerating their symptoms  Bluford FORBES Zoe DELENA Karon DELENA, et al. Surgery versus conservative management of stable thoracolumbar fracture: the PRESTO feasibility RCT. [place] ([place]): [employer]; 2021 Nov. [facility] Technology Assessment, No. 25.62.) Appendix 3, Oswestry Disability Index category descriptors. Available from: Findjewelers.cz  Minimally Clinically Important Difference (MCID) = 12.8%     COGNITION: Overall cognitive status: Within functional  limits for tasks assessed     SENSATION: Not tested  MUSCLE LENGTH: Tightness in R>L HS   POSTURE: R convexity scoliotic curve with subsequent depressed R shoulder and scapula and elevated L illium  PALPATION: Palpation: TTP at B lumbar and gluteals. Increased tissue tension in R lumbar due to scoliotic curve  LUMBAR ROM: WNL painful with L SB and R rotation. LEROM: WNL some pain in B ant hip with IR/ADD and tightness with ER   LOWER EXTREMITY MMT: hip flexion cogwheels  MMT Right eval Left eval  Hip flexion 4+ 4+  Hip extension 4+ 4+  Hip abduction 4   Hip adduction 5   Hip internal rotation    Hip external rotation    Knee flexion 4+ 4+  Knee extension 5 5  Ankle dorsiflexion 5   Ankle plantarflexion    Ankle inversion 5   Ankle eversion 5*pain    (Blank rows = not tested)   FUNCTIONAL TESTS: 5 times sit to stand: 10.73 sec  TREATMENT DATE:   09/17/24 Nustep x 5 min level 2 Standing left side trunk stretch for scoliosis Reviewed HEP Seated cervical isometrics: flexion, extension, rotation R and L, side bending R and L.   X 5 each direction holding 5 sec (patient reported pain throughout all isometrics and feeling like her joints were coming out of place) Attempted postural strengthening: yellow tband: shoulder extension, rows, bilateral ER and bilateral horizontal abduction 2 x 10 each Seated mini sit ups with 2 lbs 2 x 10 Seated modified Russian twist 2 x 10 with 2 lbs Seated shoulder to hip 2 x 10 with 2 lbs (Patient reported pain at end of session, we discussed the nature of EDS and how stabilizing is her best option for controlling her pain, explained that there is typically pain with workouts but she may not be able to tolerate and need to do pool therapy.  We had her change 1 appt per week to pool.    09/03/24  See pt ed and HEP     PATIENT EDUCATION:  Education details: PT eval findings, anticipated POC, initial HEP, and fee schedule for TPDN and lack of insurance coverage requiring payment at time of service   Person educated: Patient Education method: Explanation, Demonstration, Tactile cues, Verbal cues, and Handouts Education comprehension: verbalized understanding and returned demonstration  HOME EXERCISE PROGRAM: Access Code: [email or web address] URL: [email or web address] Date: 09/03/2024 Prepared by: Mliss  Exercises - Supine Transversus Abdominis Bracing - Hands on Stomach  - 2 x daily - 7 x weekly - 1 sets - 10 reps - 5 sec hold - Supine March  - 1 x daily - 7 x weekly - 2 sets - 10 reps - Plank on Knees  - 1 x daily - 7 x weekly - 1 sets - 5 reps - max hold hold  ASSESSMENT:  CLINICAL IMPRESSION: Zari had pain with almost all activity today as expected with EDS but the exercises were extremely simple and with very little resistance.  We discussed possibly adding aquatic therapy appointments if she is unable to tolerate the land therapy.  She may benefit from continued skilled PT but will likely have difficulty tolerating formal PT.  [facility] who already administers her soft tissue mobilization, massage, cupping and other conservative measures.  Prognosis is poor for good response to PT as she mentions feeling like her joints are dislocating with even simple low grade isometrics.   .   OBJECTIVE IMPAIRMENTS: decreased activity tolerance, decreased strength, increased muscle spasms, impaired flexibility, impaired sensation, postural dysfunction, and pain.   ACTIVITY LIMITATIONS: lifting, bending, sitting, standing, sleeping, bathing, dressing, hygiene/grooming, and locomotion level  PARTICIPATION LIMITATIONS: meal prep, cleaning, laundry, driving, shopping, community activity, and occupation  PERSONAL FACTORS: Time since onset of injury/illness/exacerbation  and 1 comorbidity: Ehler's Danlos Syndrome are also affecting patient's functional outcome.   REHAB POTENTIAL: Good  CLINICAL DECISION MAKING: Evolving/moderate complexity  EVALUATION COMPLEXITY: Moderate   GOALS: Goals reviewed with patient? Yes  SHORT TERM GOALS: Target date: 10/01/2024   Patient will be independent with initial HEP.  Baseline:  Goal status: INITIAL  2.  Patient will report decrease in overall pain by 30% with ADLS Baseline:  Goal status: INITIAL  3.  Patient able to perform a full plank for 20 sec showing improved core strength. Baseline:  Goal status: INITIAL   LONG TERM GOALS: Target date: 10/29/2024   Patient will be independent with advanced/ongoing HEP to improve outcomes and  carryover.  Baseline:  Goal status: INITIAL  2.  Patient will report 75% improvement in pain with ADLs to improve QOL.  Baseline:  Goal status: INITIAL  3.  Patient to demonstrate improved hip flexor strength to 5/5.   Baseline:  Goal status: INITIAL  4.   Patient will score 23 or less on the Modified Oswestry demonstrating improved functional ability.  Baseline: 29 Goal status: INITIAL      PLAN:  PT FREQUENCY: 2x/week  PT DURATION: 8 weeks  PLANNED INTERVENTIONS: 97110-Therapeutic exercises, 97530- Therapeutic activity, [id number]- Neuromuscular re-education, 97535- Self Care, [id number]- Manual therapy, [phone]- Gait training, [phone]- Aquatic Therapy, [phone]- Electrical stimulation (unattended), [phone]- Electrical stimulation (manual), [id number]- Vasopneumatic device, [id number]- Ultrasound, [id number]- Ionotophoresis 4mg /ml Dexamethasone, [id number] (1-2 muscles), [id number] (3+ muscles)- Dry Needling, Patient/Family education, Balance training, Stair training, Taping, Joint mobilization, Cryotherapy, and Moist heat  PLAN FOR NEXT SESSION: Assess response to first session, review and progress HEP, focus on core strength for EDS and hip flexor strength, try cupping for back pain and possibly DN at  some point.    Delon B. Rithy Mandley, PT 09/17/24 11:56 AM [facility] [address], [address] [place], [place] [address] Phone # [phone] Fax [phone]  "

## 2024-09-18 ENCOUNTER — Ambulatory Visit: Admitting: Physical Therapy

## 2024-09-18 ENCOUNTER — Ambulatory Visit: Attending: Family Medicine | Admitting: Physical Therapy

## 2024-09-18 ENCOUNTER — Encounter: Payer: Self-pay | Admitting: *Deleted

## 2024-09-18 ENCOUNTER — Other Ambulatory Visit: Admitting: *Deleted

## 2024-09-18 DIAGNOSIS — M6281 Muscle weakness (generalized): Secondary | ICD-10-CM
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
## 2024-09-23 ENCOUNTER — Ambulatory Visit: Admitting: Physical Therapy

## 2024-09-25 ENCOUNTER — Ambulatory Visit: Admitting: Physical Therapy

## 2024-09-29 ENCOUNTER — Ambulatory Visit: Admitting: Physical Therapy

## 2024-09-30 ENCOUNTER — Other Ambulatory Visit: Admitting: *Deleted

## 2024-10-01 ENCOUNTER — Ambulatory Visit: Admitting: Physical Therapy

## 2024-10-07 ENCOUNTER — Ambulatory Visit (HOSPITAL_BASED_OUTPATIENT_CLINIC_OR_DEPARTMENT_OTHER)

## 2024-10-08 ENCOUNTER — Ambulatory Visit: Admitting: Physical Therapy

## 2024-10-14 ENCOUNTER — Ambulatory Visit: Admitting: Family Medicine

## 2024-10-14 ENCOUNTER — Ambulatory Visit: Admitting: Physical Therapy

## 2024-10-16 ENCOUNTER — Ambulatory Visit: Admitting: Physical Therapy

## 2024-10-16 ENCOUNTER — Ambulatory Visit (HOSPITAL_BASED_OUTPATIENT_CLINIC_OR_DEPARTMENT_OTHER): Admitting: Physical Therapy

## 2024-10-20 ENCOUNTER — Ambulatory Visit: Admitting: Physical Therapy

## 2024-10-22 ENCOUNTER — Ambulatory Visit: Admitting: Physical Therapy

## 2024-10-27 ENCOUNTER — Ambulatory Visit: Admitting: Physical Therapy

## 2024-10-29 ENCOUNTER — Ambulatory Visit: Admitting: Physical Therapy

## 2024-10-30 ENCOUNTER — Ambulatory Visit: Admitting: Physical Therapy

## 2025-09-21 ENCOUNTER — Ambulatory Visit: Admitting: Neurology
